# Patient Record
Sex: Female | Born: 1975
Health system: Southern US, Community
[De-identification: ages and names within clinical notes are randomized; demographics above are authoritative.]

## PROBLEM LIST (undated history)

## (undated) ENCOUNTER — Ambulatory Visit: Admission: EM | Payer: Commercial Managed Care - PPO

## (undated) DIAGNOSIS — Z9889 Other specified postprocedural states: Secondary | ICD-10-CM

## (undated) DIAGNOSIS — R112 Nausea with vomiting, unspecified: Secondary | ICD-10-CM

## (undated) DIAGNOSIS — T8859XA Other complications of anesthesia, initial encounter: Secondary | ICD-10-CM

## (undated) DIAGNOSIS — G8929 Other chronic pain: Secondary | ICD-10-CM

## (undated) DIAGNOSIS — N939 Abnormal uterine and vaginal bleeding, unspecified: Secondary | ICD-10-CM

## (undated) DIAGNOSIS — E039 Hypothyroidism, unspecified: Secondary | ICD-10-CM

## (undated) HISTORY — PX: TONSILLECTOMY: SUR1361

## (undated) HISTORY — PX: TUBAL LIGATION: SHX77

---

## 1998-08-29 ENCOUNTER — Ambulatory Visit (HOSPITAL_COMMUNITY): Admission: RE | Admit: 1998-08-29 | Discharge: 1998-08-29 | Payer: Self-pay | Admitting: Obstetrics and Gynecology

## 1998-11-02 ENCOUNTER — Inpatient Hospital Stay (HOSPITAL_COMMUNITY): Admission: AD | Admit: 1998-11-02 | Discharge: 1998-11-02 | Payer: Self-pay | Admitting: Obstetrics and Gynecology

## 1998-11-03 ENCOUNTER — Inpatient Hospital Stay (HOSPITAL_COMMUNITY): Admission: AD | Admit: 1998-11-03 | Discharge: 1998-11-03 | Payer: Self-pay | Admitting: Obstetrics and Gynecology

## 1998-11-17 ENCOUNTER — Inpatient Hospital Stay (HOSPITAL_COMMUNITY): Admission: AD | Admit: 1998-11-17 | Discharge: 1998-11-19 | Payer: Self-pay | Admitting: Obstetrics and Gynecology

## 1999-03-30 ENCOUNTER — Emergency Department (HOSPITAL_COMMUNITY): Admission: EM | Admit: 1999-03-30 | Discharge: 1999-03-30 | Payer: Self-pay | Admitting: Emergency Medicine

## 2001-11-12 HISTORY — PX: TUBAL LIGATION: SHX77

## 2003-11-22 ENCOUNTER — Ambulatory Visit (HOSPITAL_COMMUNITY): Admission: RE | Admit: 2003-11-22 | Discharge: 2003-11-22 | Payer: Self-pay | Admitting: Gastroenterology

## 2004-06-15 ENCOUNTER — Emergency Department (HOSPITAL_COMMUNITY): Admission: EM | Admit: 2004-06-15 | Discharge: 2004-06-16 | Payer: Self-pay

## 2005-12-14 ENCOUNTER — Encounter (INDEPENDENT_AMBULATORY_CARE_PROVIDER_SITE_OTHER): Payer: Self-pay | Admitting: Specialist

## 2005-12-14 ENCOUNTER — Ambulatory Visit (HOSPITAL_BASED_OUTPATIENT_CLINIC_OR_DEPARTMENT_OTHER): Admission: RE | Admit: 2005-12-14 | Discharge: 2005-12-14 | Payer: Self-pay | Admitting: Otolaryngology

## 2006-07-02 ENCOUNTER — Ambulatory Visit: Payer: Self-pay | Admitting: Family Medicine

## 2006-07-02 ENCOUNTER — Encounter: Payer: Self-pay | Admitting: Family Medicine

## 2006-07-03 ENCOUNTER — Ambulatory Visit (HOSPITAL_COMMUNITY): Admission: RE | Admit: 2006-07-03 | Discharge: 2006-07-03 | Payer: Self-pay | Admitting: Gynecology

## 2007-03-17 ENCOUNTER — Encounter: Admission: RE | Admit: 2007-03-17 | Discharge: 2007-03-17 | Payer: Self-pay | Admitting: Endocrinology

## 2008-06-21 ENCOUNTER — Encounter: Admission: RE | Admit: 2008-06-21 | Discharge: 2008-06-21 | Payer: Self-pay | Admitting: Endocrinology

## 2008-06-30 ENCOUNTER — Emergency Department (HOSPITAL_COMMUNITY): Admission: EM | Admit: 2008-06-30 | Discharge: 2008-06-30 | Payer: Self-pay | Admitting: Family Medicine

## 2009-03-21 ENCOUNTER — Ambulatory Visit: Payer: Self-pay | Admitting: Obstetrics & Gynecology

## 2009-10-18 ENCOUNTER — Ambulatory Visit: Payer: Self-pay | Admitting: Obstetrics & Gynecology

## 2009-10-21 ENCOUNTER — Ambulatory Visit (HOSPITAL_COMMUNITY): Admission: RE | Admit: 2009-10-21 | Discharge: 2009-10-21 | Payer: Self-pay | Admitting: Obstetrics & Gynecology

## 2010-10-24 ENCOUNTER — Ambulatory Visit: Payer: Self-pay | Admitting: Family Medicine

## 2010-10-27 ENCOUNTER — Emergency Department (HOSPITAL_COMMUNITY)
Admission: EM | Admit: 2010-10-27 | Discharge: 2010-10-27 | Payer: Self-pay | Source: Home / Self Care | Admitting: Emergency Medicine

## 2010-10-30 ENCOUNTER — Ambulatory Visit: Payer: Self-pay | Admitting: Obstetrics and Gynecology

## 2010-11-21 ENCOUNTER — Ambulatory Visit: Admit: 2010-11-21 | Payer: Self-pay | Admitting: Family Medicine

## 2011-01-16 ENCOUNTER — Ambulatory Visit: Payer: Self-pay | Admitting: Obstetrics and Gynecology

## 2011-01-22 LAB — URINALYSIS, ROUTINE W REFLEX MICROSCOPIC
Bilirubin Urine: NEGATIVE
Glucose, UA: NEGATIVE mg/dL
Ketones, ur: NEGATIVE mg/dL
Nitrite: NEGATIVE
Protein, ur: 30 mg/dL — AB
Specific Gravity, Urine: 1.013 (ref 1.005–1.030)
Urobilinogen, UA: 1 mg/dL (ref 0.0–1.0)
pH: 7 (ref 5.0–8.0)

## 2011-01-22 LAB — URINE MICROSCOPIC-ADD ON

## 2011-01-22 LAB — POCT PREGNANCY, URINE: Preg Test, Ur: NEGATIVE

## 2011-03-27 NOTE — Assessment & Plan Note (Signed)
NAME:  Sherri [name], Sherri [name] NO.:  [id number]   MEDICAL RECORD NO.:  [id number]          PATIENT TYPE:  POB   LOCATION:  [facility] at [facility]         FACILITY:  [facility]   PHYSICIAN:  Scheryl Darter, MD       DATE OF BIRTH:  [birth year]   DATE OF SERVICE:                                  CLINIC NOTE   CHIEF COMPLAINT:  Heavy period.   HISTORY OF PRESENT ILLNESS:  The patient is a 35 year old white female,  gravida 2.  She had been gravida 3, para 2-0-1-2, last menstrual period  October 01, 2009 She says for last 2-3 months periods have gotten very  heavy using super tampon and bleeding through them on to her clothing.  Periods last about 5 days.  She also has occasional left lower quadrant  pain, which is increased during her menses.  Pain radiates into her  back.  She is status post tubal ligation at time of cesarean section in  2003.  She says that about 2 years ago she was seen by Dr. Tiburcio Pea in  [place] due to an ovarian cyst and she had this removed with  laparoscopy.   PAST MEDICAL HISTORY:  1. Asthma.  2. Irritable bowel syndrome with constipation, which she says has      improved and she says it seems to be better just taking a      multivitamin.   MEDICATIONS:  Multivitamin and occasional ibuprofen for pain.   PAST SURGICAL HISTORY:  Tonsillectomy, arthroscopic right knee surgery,  repair of deviated septum, cesarean section, tubal ligation, laparoscopy  and ovarian cystectomy.   ALLERGIES:  Allergies to contrast dye, codeine, morphine, and  erythromycin.   SOCIAL HISTORY:  The patient denies alcohol, tobacco, or drug use.   FAMILY HISTORY:  Coronary heart disease and hypertension.   PHYSICAL EXAMINATION:  GENERAL:  The patient in no acute distress.  VITAL SIGNS:  Weight 206 pounds, BP 125/87, pulse 70, and height 5 foot  5.  ABDOMEN:  Soft, nontender, and no mass.  PELVIC:  External genitalia, vagina, and cervix appeared normal.  Uterus  is normal  size, nontender with some slight tenderness in the  left side.  No mass.  No significant findings in the right adnexa.   IMPRESSION:  Pain and menorrhagia.   PLAN:  Ordered a pelvic ultrasound.  She will return to review this  results.  Gave prescription for her Lysteda 650 mg 2 tablets p.o. t.i.d.  for up to 5 days during her menses.  I also gave her prescription for  Anaprox 275 mg p.o. q.8-12 h. p.r.n. pain.  She may be a candidate for  endometrial ablation.  I gave her a pamphlet on NovaSure.      Scheryl Darter, MD     JA/MEDQ  D:  10/18/2009  T:  10/19/2009  Job:  [id number]

## 2011-03-27 NOTE — Assessment & Plan Note (Signed)
NAME:  GRAE, LEATHERS NO.:  [id number]   MEDICAL RECORD NO.:  [id number]          PATIENT TYPE:  POB   LOCATION:  [facility] at [facility]         FACILITY:  [facility]   PHYSICIAN:  Argentina Donovan, MD        DATE OF BIRTH:  09-14-1976   DATE OF SERVICE:  10/30/2010                                  CLINIC NOTE   The patient is a 35 year old Caucasian female gravida 2, para 2-0-0-2  who underwent C-section 8 years ago.  Over the past couple of years, he  has had increased bleeding following a tubal ligation and numbness and  burning with severe pain on the left side in the area of the transverse  low abdominal incision.  She had an ultrasound a year ago that was  completely normal.  The pain seems to stay there whether she has a  period or not, although it is worse during her period.   On examination her abdomen is soft, flat, tender in that area without  rebound or guarding.  Bimanual is confirmatory for the pain in that area  and pushing up from below and down from above, I cannot really  demonstrate a hernia nor do I feel a lump denoting a neuroma nor  endometriosis.   I am going to put the patient on birth control pills for a couple of  months to see if that does control her bleeding and also to see whether  it effects of pain or not.  I am not exactly sure what to do about the  pain if the pain persists in spite of controlling the bleeding.  I think  that probably she has a numbness there secondary to the incision and the  pain is being magnified because the area around it seems numb.  In any  case, I am going to have her come back in a couple of months after she  is on oral contraceptives for reevaluation.           ______________________________  Argentina Donovan, MD     PR/MEDQ  D:  10/30/2010  T:  10/31/2010  Job:  [id number]

## 2011-03-30 NOTE — Op Note (Signed)
NAME:  Sherri [name], OSE NO.:  [id number]   MEDICAL RECORD NO.:  [id number]          PATIENT TYPE:  AMB   LOCATION:  [facility]                          FACILITY:  [facility]   PHYSICIAN:  Hermelinda Medicus, M.D.   DATE OF BIRTH:  1976-01-13   DATE OF PROCEDURE:  DATE OF DISCHARGE:                                 OPERATIVE REPORT   PREOPERATIVE DIAGNOSIS:  Septal deviation, nasal obstruction, turbinate  hypertrophy, with snoring.   POSTOPERATIVE DIAGNOSIS:  Septal deviation, nasal obstruction, turbinate  hypertrophy, with snoring.   OPERATION:  Septal reconstruction, turbinate reduction, with the laser-  assisted uvulopalatoplasty under local MAC anesthesia with Dr. Jacklynn Bue.   SURGEON:  Hermelinda Medicus, M.D.   PROCEDURE:  The patient was placed in the supine position under local MAC  anesthesia and was prepped and draped in usual manner using the usual head  drape, and then the nose was anesthetized using 200 mg of topical cocaine  and also 6 cc of 1% Xylocaine with epinephrine. The columella was first  approached, which was deviated way over to the left and was also widened  apparently, probably from some of this trauma in the past. This was  mobilized on the left side and brought back. Scar tissue was removed, and  this was trimmed also, as she had somewhat of a drooping or hanging  columella, or a bowing columella. Then, the mucoperichondrium was elevated  back along on the left side and then back.  A strip of quadrilateral  cartilage was taken posteriorly, and then the ethmoid septal deviation was  approached that was over to the right using the open and closed Leane Para-  Middletons and [employer] forceps. A large spur was also on the right side,  and this was taken down using the 4 mm chisel and the Takahashi forceps once  again.  The cartilaginous septum was then set on the midline. Once this was  set in the midline, and the once the bowing of the cartilaginous septum was  corrected, the columella deviation was also much easier to correct. This was  brought back into the midline between the medial crura of the lower lateral  cartilages, and then the closure of the incision was made with 5-0 plain  catgut and then through-and-through septal suture using 4-0 plain as a  hemostatic stitch, as a blanket stitch, and also to ensure the columella  stayed in its midline position.  This was done x2. At this point, two 8 cm  Merocel packs were placed, and then the oral cavity was approached, where we  sprayed the throat with Cetacaine, and then we used 1% Xylocaine with  epinephrine once again in the two areas of the palate and the uvula. The  laser was set at 10 watts continuous, and lateral superior cuts were made of  approximately 1 cm in length on each side of the uvula, and the enlarged  uvula was trimmed to approximately half its size, more to the normal size.  There was no bleeding noted, and there was no problem during this procedure.  The patient tolerated  the procedure very well. The time of laser was 6  minutes. All glasses were used on surgical  employees, and the patient's face and eyes were carefully draped with cool  damp towels and 4x4 sponges over the eyes. The patient tolerated the  procedure very well and is doing well postop,and will follow up with me this  afternoon at 3:00 p.m., and then in 5 days then in 2 weeks, 4 weeks 6 weeks,  3 months, and 6 months.           ______________________________  Hermelinda Medicus, M.D.     JC/MEDQ  D:  12/14/2005  T:  12/14/2005  Job:  [id number]   cc:   Molly Maduro A. Nicholos Johns, M.D.  Fax: [phone]
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
## 2011-03-30 NOTE — Assessment & Plan Note (Signed)
NAME:  Sherri [name], Sherri [name] NO.:  [id number]   MEDICAL RECORD NO.:  [id number]          PATIENT TYPE:  POB   LOCATION:  [facility] at [facility]         FACILITY:  [facility]   PHYSICIAN:  Tinnie Gens, MD        DATE OF BIRTH:  1976-06-28   DATE OF SERVICE:  07/02/2006                                    CLINIC NOTE   CHIEF COMPLAINT:  Physical exam.   HISTORY OF PRESENT ILLNESS:  The patient is a 35 year old gravida 3, para 2-  0-1-2 who comes in for yearly exam.  She has been having regular cycles once  a month, they are heavy in nature and last for approximately 5-7 days,  previously had lighter cycles but was on the pill at the time.  Her last  delivery she had a C-section and tubal ligation and is on nothing for birth  control.  The patient is concerned today about some weight gain and fatigue  she has been having.   PAST MEDICAL HISTORY:  1. Asthma.  She has albuterol MDI she uses p.r.n.  2. Irritable bowel syndrome with constipation she uses MiraLax for p.r.n.   SURGICAL HISTORY:  She had tonsillectomy, arthroscopic right knee surgery,  repair of deviated septum and C-section and tubal.   MEDICATIONS:  She is on multivitamin a day.   ALLERGIES:  CONTRAST DYE, CODEINE, MORPHINE, AND ERYTHROMYCIN.   OBSTETRICAL HISTORY:  G3, P2-1 C-section plus tubal ligation.   GYNECOLOGIC HISTORY:  No history of abnormal Pap smear.   FAMILY HISTORY:  Coronary artery disease, hypertension.   SOCIAL HISTORY:  No tobacco, alcohol or drug use.   REVIEW OF SYSTEMS:  Fourteen point review of systems is reviewed.  Please  see GYN history in the chart but is essentially negative except as in the  HPI.   EXAMINATION:  VITAL SIGNS:  Blood pressure is 123/71, weight is 191, pulse  is 58.  GENERAL:  She is a well-developed, well-nourished female in no acute  distress.  RESPIRATORY:  Lungs are clear bilaterally.  CV:  Regular rate and rhythm.  No rubs, gallops, or murmurs.  ABDOMEN:   Soft, nontender, nondistended.  No masses.  NECK:  Supple.  She has an enlarged soft portion of the thyroid on her right  side.  BREASTS:  Symmetrical with everted nipples.  No masses.  LYMPHATICS:  No supraclavicular or axillary adenopathy.  GU:  Normal external female genitalia.  BUS is normal.  Vagina is pink and  rugae.  Cervix is parous without lesion.  Uterus is small and anteverted.  Adnexa are without masses or tenderness.  EXTREMITIES:  No cyanosis, clubbing, or edema.   IMPRESSION:  1. Goiter.  2. Gynecologic exam.   PLAN:  1. Pap smear __________ .  2. Thyroid ultrasound to be followed up by probable radioactive iodine      uptake test.  Results should be forwarded once they are received to her      primary care physician.           ______________________________  Tinnie Gens, MD     TP/MEDQ  D:  07/02/2006  T:  07/03/2006  Job:  [id number]

## 2011-03-30 NOTE — Op Note (Signed)
NAME:  Sherri [name], Sherri [name]                          ACCOUNT NO.:  [id number]   MEDICAL RECORD NO.:  [id number]                   PATIENT TYPE:  AMB   LOCATION:  [facility]                                 FACILITY:  [facility]   PHYSICIAN:  John C. Madilyn Fireman, M.D.                 DATE OF BIRTH:  04-22-1976   DATE OF PROCEDURE:  11/22/2003  DATE OF DISCHARGE:                                 OPERATIVE REPORT   PROCEDURE:  Colonoscopy.   INDICATIONS FOR PROCEDURE:  Family history of colon polyps in first-degree  relative, as well as chronic constipation with abdominal irritable bowel  syndrome; with suboptimal response to medical therapy.   DESCRIPTION OF PROCEDURE:  The patient was placed in the left lateral  decubitus position and placed on the pulse monitor, with continuous low-flow  oxygen delivered by nasal cannula.  She was sedated with 52.5 mcg IV  fentanyl and 6 mg IV Versed.  The Olympus video colonoscope was inserted  into the rectum and advanced.  Despite multiple abdominal pressures due to  torsion, and repositioning as I was unable to reach the cecum; thus poor  visualization but felt to be somewhere in the transverse colon.  This area  appeared normal, as did the ascending, sigmoid and rectum -- with no masses,  polyps, diverticula or other mucosal abnormalities.  The scope was then  withdrawn and the patient returned to the recovery room in stable condition.  She tolerated the procedure well and there were no immediate complications.   IMPRESSION:  Normal incomplete colonoscopy to the transverse colon.   PLAN:  Follow-up barium enema.                                               John C. Madilyn Fireman, M.D.    JCH/MEDQ  D:  11/22/2003  T:  11/22/2003  Job:  [id number]

## 2011-05-23 ENCOUNTER — Telehealth: Payer: Self-pay

## 2011-05-23 NOTE — Telephone Encounter (Signed)
Patient is calling about a refill on Macrobid. She said pharmacy sent over a request twice already. Please call it in to [facility] in [place]. Their should be a prescription fax for it. Thanks!

## 2011-05-24 ENCOUNTER — Other Ambulatory Visit: Payer: Self-pay | Admitting: *Deleted

## 2011-05-24 ENCOUNTER — Telehealth: Payer: Self-pay

## 2011-05-24 DIAGNOSIS — N390 Urinary tract infection, site not specified: Secondary | ICD-10-CM

## 2011-05-24 MED ORDER — NITROFURANTOIN MONOHYD MACRO 100 MG PO CAPS: 100.0000 mg | ORAL_CAPSULE | Freq: Two times a day (BID) | ORAL | Status: AC

## 2011-05-24 MED ORDER — NITROFURANTOIN MACROCRYSTAL 50 MG PO CAPS: 50.0000 mg | ORAL_CAPSULE | ORAL | Status: DC

## 2011-05-24 NOTE — Telephone Encounter (Signed)
Patient called back saying that she needs you to call two meds in. She thinks there was a mis communication due to her phone cutting off. She needs the med for before and after sex and also for a UTI b/c she has one now. She apologies for the confusion.

## 2011-05-24 NOTE — Telephone Encounter (Signed)
Patient is currently having a urinary tract infection because she had run out of the Macrodantin.  Request refill of Macrobid

## 2011-05-24 NOTE — Telephone Encounter (Signed)
Patient has frequent uti's post intercourse and used the Macrodantin to prevent.

## 2011-06-12 ENCOUNTER — Telehealth: Payer: Self-pay | Admitting: *Deleted

## 2011-06-12 DIAGNOSIS — A499 Bacterial infection, unspecified: Secondary | ICD-10-CM

## 2011-06-12 MED ORDER — METRONIDAZOLE 500 MG PO TABS: 500.0000 mg | ORAL_TABLET | Freq: Two times a day (BID) | ORAL | Status: AC

## 2011-06-12 NOTE — Telephone Encounter (Signed)
Patient complains of vaginal discharge that has a slight odor and irritation.  She has been swimming a lot this week.  Feels like she has a bacterial infection.

## 2011-12-04 ENCOUNTER — Encounter (HOSPITAL_COMMUNITY): Payer: Self-pay | Admitting: Emergency Medicine

## 2011-12-04 ENCOUNTER — Emergency Department (INDEPENDENT_AMBULATORY_CARE_PROVIDER_SITE_OTHER): Payer: 59

## 2011-12-04 ENCOUNTER — Emergency Department (INDEPENDENT_AMBULATORY_CARE_PROVIDER_SITE_OTHER)
Admission: EM | Admit: 2011-12-04 | Discharge: 2011-12-04 | Disposition: A | Discharge status: Home / Self Care | Payer: 59 | Source: Home / Self Care

## 2011-12-04 DIAGNOSIS — S161XXA Strain of muscle, fascia and tendon at neck level, initial encounter: Secondary | ICD-10-CM

## 2011-12-04 DIAGNOSIS — R51 Headache: Secondary | ICD-10-CM

## 2011-12-04 DIAGNOSIS — S139XXA Sprain of joints and ligaments of unspecified parts of neck, initial encounter: Secondary | ICD-10-CM

## 2011-12-04 MED ORDER — IBUPROFEN 800 MG PO TABS: 800.0000 mg | ORAL_TABLET | Freq: Three times a day (TID) | ORAL | Status: AC

## 2011-12-04 MED ORDER — METHOCARBAMOL 750 MG PO TABS: 750.0000 mg | ORAL_TABLET | Freq: Four times a day (QID) | ORAL | Status: AC

## 2011-12-04 MED ORDER — TRAMADOL HCL 50 MG PO TABS: 50.0000 mg | ORAL_TABLET | Freq: Four times a day (QID) | ORAL | Status: AC | PRN
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
## 2011-12-05 NOTE — ED Provider Notes (Signed)
Medical screening examination/treatment/procedure(s) were performed by non-physician practitioner and as supervising physician I was immediately available for consultation/collaboration.  Raynald Blend, MD 12/05/11 1535

## 2014-01-13 ENCOUNTER — Ambulatory Visit: Payer: Self-pay | Admitting: Obstetrics & Gynecology

## 2014-03-22 ENCOUNTER — Emergency Department (INDEPENDENT_AMBULATORY_CARE_PROVIDER_SITE_OTHER)
Admission: EM | Admit: 2014-03-22 | Discharge: 2014-03-22 | Disposition: A | Discharge status: Home / Self Care | Payer: 59 | Source: Home / Self Care | Attending: Family Medicine | Admitting: Family Medicine

## 2014-03-22 ENCOUNTER — Encounter (HOSPITAL_COMMUNITY): Payer: Self-pay | Admitting: Emergency Medicine

## 2014-03-22 DIAGNOSIS — N390 Urinary tract infection, site not specified: Secondary | ICD-10-CM

## 2014-03-22 LAB — POCT URINALYSIS DIP (DEVICE)
Bilirubin Urine: NEGATIVE
Glucose, UA: 100 mg/dL — AB
Ketones, ur: NEGATIVE mg/dL
Nitrite: POSITIVE — AB
Protein, ur: 30 mg/dL — AB
Specific Gravity, Urine: 1.01 (ref 1.005–1.030)
Urobilinogen, UA: 0.2 mg/dL (ref 0.0–1.0)
pH: 6.5 (ref 5.0–8.0)

## 2014-03-22 LAB — POCT PREGNANCY, URINE: Preg Test, Ur: NEGATIVE

## 2014-03-22 MED ORDER — NITROFURANTOIN MACROCRYSTAL 50 MG PO CAPS: ORAL_CAPSULE | ORAL | Status: DC

## 2014-03-22 MED ORDER — CEPHALEXIN 500 MG PO CAPS: 500.0000 mg | ORAL_CAPSULE | Freq: Two times a day (BID) | ORAL | Status: DC

## 2014-03-22 NOTE — ED Notes (Signed)
uti symptoms: burning with urination, urgency, frequency.

## 2014-03-22 NOTE — ED Provider Notes (Signed)
Sherri CossDenise M [name] is a 38 y.o. female who presents to Urgent Care today for UTI symptoms. Patient has had 2-3 days of urinary frequency urgency burning and pressure. Symptoms are consistent with prior episodes of UTI. She's tried over-the-counter urinary tract infection medications which have not helped. She denies any fevers or chills nausea vomiting or diarrhea. She feels well otherwise.   History reviewed. No pertinent past medical history. History  Substance Use Topics  . Smoking status: Never Smoker   . Smokeless tobacco: Not on file  . Alcohol Use: No   ROS as above Medications: No current facility-administered medications for this encounter.   Current Outpatient Prescriptions  Medication Sig Dispense Refill  . cephALEXin (KEFLEX) 500 MG capsule Take 1 capsule (500 mg total) by mouth 2 (two) times daily.  14 capsule  0  . nitrofurantoin (MACRODANTIN) 50 MG capsule 1 pill po pose intercourse.  30 capsule  1    Exam:  BP 128/61  Pulse 70  Temp(Src) 98.5 F (36.9 C) (Oral)  Resp 16  SpO2 99%  LMP 03/06/2014 Gen: Well NAD HEENT: EOMI,  MMM Lungs: Normal work of breathing. CTABL Heart: RRR no MRG Abd: NABS, Soft. NT, ND no CV angle tenderness to percussion Exts: Brisk capillary refill, warm and well perfused.   Results for orders placed during the hospital encounter of 03/22/14 (from the past 24 hour(s))  POCT URINALYSIS DIP (DEVICE)     Status: Abnormal   Collection Time    03/22/14  9:29 AM      Result Value Ref Range   Glucose, UA 100 (*) NEGATIVE mg/dL   Bilirubin Urine NEGATIVE  NEGATIVE   Ketones, ur NEGATIVE  NEGATIVE mg/dL   Specific Gravity, Urine 1.010  1.005 - 1.030   Hgb urine dipstick MODERATE (*) NEGATIVE   pH 6.5  5.0 - 8.0   Protein, ur 30 (*) NEGATIVE mg/dL   Urobilinogen, UA 0.2  0.0 - 1.0 mg/dL   Nitrite POSITIVE (*) NEGATIVE   Leukocytes, UA LARGE (*) NEGATIVE  POCT PREGNANCY, URINE     Status: None   Collection Time    03/22/14  9:33 AM   Result Value Ref Range   Preg Test, Ur NEGATIVE  NEGATIVE   No results found.  Assessment and Plan: 38 y.o. female with UTI. Plan treatment with Keflex. Patient has been prescribed nitrofurantoin for prevention of UTI post intercourse. We'll also refill this. Recommend followup with primary care provider.  Discussed warning signs or symptoms. Please see discharge instructions. Patient expresses understanding.    Rodolph BongEvan S Aasia Peavler, MD 03/22/14 1034

## 2014-03-22 NOTE — Discharge Instructions (Signed)
Thank you for coming in today. Take Keflex twice daily for one week.  Use  Macrobid after intercourse to prevent UTI . Followup with your OB/GYN or primary care Dr. for further refills of this medication .  If your belly pain worsens, or you have high fever, bad vomiting, blood in your stool or black tarry stool go to the Emergency Room.   Urinary Tract Infection Urinary tract infections (UTIs) can develop anywhere along your urinary tract. Your urinary tract is your body's drainage system for removing wastes and extra water. Your urinary tract includes two kidneys, two ureters, a bladder, and a urethra. Your kidneys are a pair of bean-shaped organs. Each kidney is about the size of your fist. They are located below your ribs, one on each side of your spine. CAUSES Infections are caused by microbes, which are microscopic organisms, including fungi, viruses, and bacteria. These organisms are so small that they can only be seen through a microscope. Bacteria are the microbes that most commonly cause UTIs. SYMPTOMS  Symptoms of UTIs may vary by age and gender of the patient and by the location of the infection. Symptoms in young women typically include a frequent and intense urge to urinate and a painful, burning feeling in the bladder or urethra during urination. Older women and men are more likely to be tired, shaky, and weak and have muscle aches and abdominal pain. A fever may mean the infection is in your kidneys. Other symptoms of a kidney infection include pain in your back or sides below the ribs, nausea, and vomiting. DIAGNOSIS To diagnose a UTI, your caregiver will ask you about your symptoms. Your caregiver also will ask to provide a urine sample. The urine sample will be tested for bacteria and white blood cells. White blood cells are made by your body to help fight infection. TREATMENT  Typically, UTIs can be treated with medication. Because most UTIs are caused by a bacterial infection, they  usually can be treated with the use of antibiotics. The choice of antibiotic and length of treatment depend on your symptoms and the type of bacteria causing your infection. HOME CARE INSTRUCTIONS  If you were prescribed antibiotics, take them exactly as your caregiver instructs you. Finish the medication even if you feel better after you have only taken some of the medication.  Drink enough water and fluids to keep your urine clear or pale yellow.  Avoid caffeine, tea, and carbonated beverages. They tend to irritate your bladder.  Empty your bladder often. Avoid holding urine for long periods of time.  Empty your bladder before and after sexual intercourse.  After a bowel movement, women should cleanse from front to back. Use each tissue only once. SEEK MEDICAL CARE IF:   You have back pain.  You develop a fever.  Your symptoms do not begin to resolve within 3 days. SEEK IMMEDIATE MEDICAL CARE IF:   You have severe back pain or lower abdominal pain.  You develop chills.  You have nausea or vomiting.  You have continued burning or discomfort with urination. MAKE SURE YOU:   Understand these instructions.  Will watch your condition.  Will get help right away if you are not doing well or get worse. Document Released: 08/08/2005 Document Revised: 04/29/2012 Document Reviewed: 12/07/2011 [facility] Patient Information 2014 [employer] [place].

## 2014-08-06 ENCOUNTER — Ambulatory Visit: Payer: Self-pay | Admitting: Obstetrics & Gynecology

## 2015-02-24 ENCOUNTER — Telehealth: Payer: Self-pay | Admitting: *Deleted

## 2015-02-24 ENCOUNTER — Encounter: Payer: Self-pay | Admitting: *Deleted

## 2015-02-24 NOTE — Telephone Encounter (Signed)
-----   Message from Pennie BanterMarni W Smith sent at 02/24/2015  9:25 AM EDT ----- Patient would like for you to call her.

## 2015-02-24 NOTE — Telephone Encounter (Signed)
Patient has questions regarding home pregnancy tests.  Advised patient that if home test was positive she needs to come in to have confirmation testing done as she had her tubes tied 12 years ago.  Last period end of Feb beginning of March but it was very light just a couple of days of spotting.

## 2015-02-28 ENCOUNTER — Other Ambulatory Visit (INDEPENDENT_AMBULATORY_CARE_PROVIDER_SITE_OTHER): Payer: 59 | Admitting: *Deleted

## 2015-02-28 DIAGNOSIS — N912 Amenorrhea, unspecified: Secondary | ICD-10-CM | POA: Diagnosis not present

## 2015-02-28 LAB — POCT URINE PREGNANCY: Preg Test, Ur: NEGATIVE

## 2015-02-28 NOTE — Progress Notes (Signed)
Patient had tubal ligation 12 years ago.  Had two positive home pregnancy tests last week.  Would like confirmation.

## 2015-03-01 LAB — HCG, QUANTITATIVE, PREGNANCY: hCG, Beta Chain, Quant, S: <2 m[IU]/mL

## 2016-01-08 ENCOUNTER — Emergency Department (HOSPITAL_COMMUNITY)
Admission: EM | Admit: 2016-01-08 | Discharge: 2016-01-08 | Disposition: A | Discharge status: Home / Self Care | Payer: Commercial Managed Care - PPO | Source: Home / Self Care | Attending: Emergency Medicine | Admitting: Emergency Medicine

## 2016-01-08 ENCOUNTER — Encounter (HOSPITAL_COMMUNITY): Payer: Self-pay | Admitting: Emergency Medicine

## 2016-01-08 DIAGNOSIS — J40 Bronchitis, not specified as acute or chronic: Secondary | ICD-10-CM | POA: Diagnosis not present

## 2016-01-08 MED ORDER — AZITHROMYCIN 250 MG PO TABS: ORAL_TABLET | ORAL | Status: DC

## 2016-01-08 MED ORDER — IPRATROPIUM-ALBUTEROL 0.5-2.5 (3) MG/3ML IN SOLN
RESPIRATORY_TRACT | Status: AC
  Filled 2016-01-08: qty 3

## 2016-01-08 MED ORDER — IPRATROPIUM-ALBUTEROL 0.5-2.5 (3) MG/3ML IN SOLN
3.0000 mL | Freq: Once | RESPIRATORY_TRACT | Status: AC
  Administered 2016-01-08: 3 mL via RESPIRATORY_TRACT

## 2016-01-08 MED ORDER — HYDROCODONE-HOMATROPINE 5-1.5 MG/5ML PO SYRP: 5.0000 mL | ORAL_SOLUTION | Freq: Four times a day (QID) | ORAL | Status: DC | PRN

## 2016-01-08 MED ORDER — PREDNISONE 50 MG PO TABS: ORAL_TABLET | ORAL | Status: DC

## 2016-01-08 MED ORDER — ALBUTEROL SULFATE HFA 108 (90 BASE) MCG/ACT IN AERS: 2.0000 | INHALATION_SPRAY | RESPIRATORY_TRACT | Status: DC | PRN

## 2016-01-08 NOTE — ED Provider Notes (Signed)
CSN: [id number]     Arrival date & time 01/08/16  1305 History   First MD Initiated Contact with Patient 01/08/16 1328     Chief Complaint  Patient presents with  . URI   (Consider location/radiation/quality/duration/timing/severity/associated sxs/prior Treatment) HPI  She is a 40 year old woman here for evaluation of cough and fever. She states for the last 3 days she has had gradually worsening nasal congestion, sore throat, and cough. Last night she developed a fever to 103 associated with chills. She does report some headaches, but denies body aches. Reports decreased appetite, but is tolerating liquids well. No nausea or vomiting. She also reports feeling short of breath with exertion. Her son was recently diagnosed with flu and bronchitis. Her father was also recently diagnosed with bronchitis. She's been trying over-the-counter medications without improvement.  History reviewed. No pertinent past medical history. Past Surgical History  Procedure Laterality Date  . Tubal ligation    . Cesarean section     No family history on file. Social History  Substance Use Topics  . Smoking status: Never Smoker   . Smokeless tobacco: None  . Alcohol Use: No   OB History    No data available     Review of Systems As in history of present illness Allergies  Codeine and Ivp dye  Home Medications   Prior to Admission medications   Medication Sig Start Date End Date Taking? Authorizing Provider  albuterol (PROVENTIL HFA;VENTOLIN HFA) 108 (90 Base) MCG/ACT inhaler Inhale 2 puffs into the lungs every 4 (four) hours as needed for wheezing or shortness of breath. 01/08/16   Charm Rings, MD  azithromycin (ZITHROMAX Z-PAK) 250 MG tablet Take 2 pills today, then 1 pill daily until gone. 01/08/16   Charm Rings, MD  HYDROcodone-homatropine (HYCODAN) 5-1.5 MG/5ML syrup Take 5 mLs by mouth every 6 (six) hours as needed for cough. 01/08/16   Charm Rings, MD  predniSONE (DELTASONE) 50 MG tablet Take  1 pill daily for 5 days. 01/08/16   Charm Rings, MD   Meds Ordered and Administered this Visit   Medications  ipratropium-albuterol (DUONEB) 0.5-2.5 (3) MG/3ML nebulizer solution 3 mL (3 mLs Nebulization Given 01/08/16 1356)    BP 120/80 mmHg  Pulse 69  Temp(Src) 97.7 F (36.5 C) (Oral)  Resp 16  SpO2 98% No data found.   Physical Exam  Constitutional: She is oriented to person, place, and time. She appears well-developed and well-nourished. No distress.  HENT:  Mouth/Throat: No oropharyngeal exudate.  Mild oropharyngeal erythema. Nasal discharge present.  Neck: Neck supple.  Cardiovascular: Normal rate, regular rhythm and normal heart sounds.   No murmur heard. Pulmonary/Chest: Effort normal. No respiratory distress. She has wheezes (occasional expiratory wheeze). She has no rales.  Lymphadenopathy:    She has no cervical adenopathy.  Neurological: She is alert and oriented to person, place, and time.    ED Course  Procedures (including critical care time)  Labs Review Labs Reviewed - No data to display  Imaging Review No results found.    MDM   1. Bronchitis    Wheezing resolved after DuoNeb.  I suspect this is more bronchitis rather than flu. Treat with prednisone and azithromycin. Hycodan as needed for cough. Albuterol as needed for wheezing. Follow-up as needed.    Charm Rings, MD 01/08/16 [id number]

## 2016-01-08 NOTE — ED Notes (Signed)
C/o cold sx onset x3 days associated w/prod cough, congestion, chlls, fever last night of 103 Taking acetaminophen and ibup w/temp relief Reports son has been dx w/flu and bronchitis A&O x4... No acute distress.

## 2016-01-08 NOTE — Discharge Instructions (Signed)
You have bronchitis. °Take azithromycin and prednisone as prescribed. °Use hycodan as needed for cough. °Use the albuterol every 4 hours as needed for wheezing or cough. °You should see improvement in the next 3-5 days. °If you develop fevers, difficulty breathing, or are just not getting better, please come back or go to the emergency room. ° °

## 2016-10-27 ENCOUNTER — Telehealth: Payer: Commercial Managed Care - PPO | Admitting: Physician Assistant

## 2016-10-27 DIAGNOSIS — R509 Fever, unspecified: Secondary | ICD-10-CM

## 2016-10-27 NOTE — Progress Notes (Signed)
Based on what you shared with me it looks like you have a  condition that should be evaluated in a face to face office visit.  You are endorsing a fever > 102. As such you need a good physical examination and assessment to rule out more serious infections and so that the appropriate treatment can be started.    NOTE: Even if you have entered your credit card information for this eVisit, you will not be charged.   If you are having a true medical emergency please call 911.  If you need an urgent face to face visit, [place] has four urgent care centers for your convenience.  If you need care fast and have a high deductible or no insurance consider:   [email or web address]  [phone]  [address], [address] [place], [place] [address] 8 am to 8 pm Monday-Friday 10 am to 4 pm Saturday-Sunday   The following sites will take your  insurance:    . [facility]  [phone] Get Driving Directions Find a Provider at this Location  [address] [place], [place] [address] . 10 am to 8 pm Monday-Friday . 12 pm to 8 pm Saturday-Sunday   . [facility]  [phone] Get Driving Directions Find a Provider at this Location  [address] [place], [place] [address] . 8 am to 8 pm Monday-Friday . 9 am to 6 pm Saturday . 11 am to 6 pm Sunday   . [facility]  [phone] Get Driving Directions  [address] [place], [place] [address] . 8 am to 8 pm Monday-Friday . 8 am to 4 pm Saturday-Sunday   . [facility] (walk-ins welcome, or call for a scheduled time)  [phone]  Get Driving Directions Find a Provider at this Location  [address] [place], [place] [address] . 8 am to 8:30 pm Monday-Thursday . 8 am to 6 pm Friday . 8 am to 4 pm Saturday-Sunday   Your e-visit answers were reviewed by a board certified advanced  clinical practitioner to complete your personal care plan.  Thank you for using e-Visits.

## 2016-11-19 ENCOUNTER — Telehealth: Payer: Commercial Managed Care - PPO | Admitting: Family

## 2016-11-19 DIAGNOSIS — J329 Chronic sinusitis, unspecified: Secondary | ICD-10-CM

## 2016-11-19 DIAGNOSIS — B9689 Other specified bacterial agents as the cause of diseases classified elsewhere: Secondary | ICD-10-CM

## 2016-11-19 MED ORDER — AMOXICILLIN-POT CLAVULANATE 875-125 MG PO TABS: 1.0000 | ORAL_TABLET | Freq: Two times a day (BID) | ORAL | 0 refills | Status: AC

## 2016-11-19 NOTE — Progress Notes (Signed)

## 2017-01-10 ENCOUNTER — Telehealth: Payer: Commercial Managed Care - PPO | Admitting: Family

## 2017-01-10 DIAGNOSIS — J329 Chronic sinusitis, unspecified: Secondary | ICD-10-CM

## 2017-01-10 DIAGNOSIS — B9789 Other viral agents as the cause of diseases classified elsewhere: Secondary | ICD-10-CM

## 2017-01-10 MED ORDER — FLUTICASONE PROPIONATE 50 MCG/ACT NA SUSP: 2.0000 | Freq: Every day | NASAL | 0 refills | Status: DC

## 2017-01-10 NOTE — Progress Notes (Signed)

## 2018-06-24 ENCOUNTER — Emergency Department (HOSPITAL_COMMUNITY)
Admission: EM | Admit: 2018-06-24 | Discharge: 2018-06-24 | Disposition: A | Discharge status: Home / Self Care | Payer: Commercial Managed Care - PPO | Attending: Emergency Medicine | Admitting: Emergency Medicine

## 2018-06-24 ENCOUNTER — Encounter (HOSPITAL_COMMUNITY): Payer: Self-pay | Admitting: *Deleted

## 2018-06-24 ENCOUNTER — Emergency Department (HOSPITAL_COMMUNITY): Payer: Commercial Managed Care - PPO

## 2018-06-24 DIAGNOSIS — M545 Low back pain: Secondary | ICD-10-CM | POA: Diagnosis not present

## 2018-06-24 DIAGNOSIS — Z79899 Other long term (current) drug therapy: Secondary | ICD-10-CM | POA: Diagnosis not present

## 2018-06-24 DIAGNOSIS — M25562 Pain in left knee: Secondary | ICD-10-CM | POA: Insufficient documentation

## 2018-06-24 MED ORDER — METHOCARBAMOL 500 MG PO TABS: 500.0000 mg | ORAL_TABLET | Freq: Two times a day (BID) | ORAL | 0 refills | Status: AC

## 2018-06-24 MED ORDER — ACETAMINOPHEN 325 MG PO TABS
650.0000 mg | ORAL_TABLET | Freq: Once | ORAL | Status: AC
  Administered 2018-06-24: 650 mg via ORAL
  Filled 2018-06-24: qty 2

## 2018-06-24 NOTE — Discharge Instructions (Addendum)
I have prescribed muscle relaxers, please do not drink or drive while taking this medication as it can make you drowsy. Please follow up with PCP in 1 week for reevaluation of your symptoms. If you experience any chest pain, shortness of breath or your symptoms worsen please return to the ED.
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
## 2018-06-24 NOTE — ED Notes (Signed)
Patient transported to X-ray 
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
## 2018-10-03 ENCOUNTER — Telehealth: Payer: Commercial Managed Care - PPO | Admitting: Family

## 2018-10-03 DIAGNOSIS — B9689 Other specified bacterial agents as the cause of diseases classified elsewhere: Secondary | ICD-10-CM

## 2018-10-03 DIAGNOSIS — J028 Acute pharyngitis due to other specified organisms: Secondary | ICD-10-CM

## 2018-10-03 MED ORDER — BENZONATATE 100 MG PO CAPS: 100.0000 mg | ORAL_CAPSULE | Freq: Three times a day (TID) | ORAL | 0 refills | Status: DC | PRN

## 2018-10-03 MED ORDER — PREDNISONE 5 MG PO TABS: 5.0000 mg | ORAL_TABLET | ORAL | 0 refills | Status: DC

## 2018-10-03 MED ORDER — ALBUTEROL SULFATE HFA 108 (90 BASE) MCG/ACT IN AERS: 2.0000 | INHALATION_SPRAY | RESPIRATORY_TRACT | 2 refills | Status: DC | PRN

## 2018-10-03 MED ORDER — AZITHROMYCIN 250 MG PO TABS: ORAL_TABLET | ORAL | 0 refills | Status: DC

## 2018-10-03 NOTE — Progress Notes (Signed)

## 2018-11-10 ENCOUNTER — Encounter: Payer: Self-pay | Admitting: Family

## 2018-11-10 ENCOUNTER — Telehealth: Payer: Commercial Managed Care - PPO | Admitting: Family

## 2018-11-10 DIAGNOSIS — R05 Cough: Secondary | ICD-10-CM

## 2018-11-10 DIAGNOSIS — J028 Acute pharyngitis due to other specified organisms: Secondary | ICD-10-CM

## 2018-11-10 DIAGNOSIS — J329 Chronic sinusitis, unspecified: Secondary | ICD-10-CM

## 2018-11-10 DIAGNOSIS — R059 Cough, unspecified: Secondary | ICD-10-CM

## 2018-11-10 DIAGNOSIS — B9689 Other specified bacterial agents as the cause of diseases classified elsewhere: Secondary | ICD-10-CM

## 2018-11-10 MED ORDER — BENZONATATE 100 MG PO CAPS: 100.0000 mg | ORAL_CAPSULE | Freq: Three times a day (TID) | ORAL | 0 refills | Status: DC | PRN

## 2018-11-10 MED ORDER — AMOXICILLIN-POT CLAVULANATE 875-125 MG PO TABS: 1.0000 | ORAL_TABLET | Freq: Two times a day (BID) | ORAL | 0 refills | Status: AC

## 2018-11-10 NOTE — Progress Notes (Signed)
Thank you for the details you included in the comment boxes. Those details are very helpful in determining the best course of treatment for you and help us to provide the best care.  We are sorry that you are not feeling well.  Here is how we plan to help!  Based on what you have shared with me it looks like you have sinusitis.  Sinusitis is inflammation and infection in the sinus cavities of the head.  Based on your presentation I believe you most likely have Acute Bacterial Sinusitis.  This is an infection caused by bacteria and is treated with antibiotics. I have prescribed Augmentin 875mg/125mg one tablet twice daily with food, for 7 days. You may use an oral decongestant such as Mucinex D or if you have glaucoma or high blood pressure use plain Mucinex. Saline nasal spray help and can safely be used as often as needed for congestion.  If you develop worsening sinus pain, fever or notice severe headache and vision changes, or if symptoms are not better after completion of antibiotic, please schedule an appointment with a health care provider.    I have also sent Tessalon Perles 100mg, take 1-2 every 8 hours as needed for cough.    Sinus infections are not as easily transmitted as other respiratory infection, however we still recommend that you avoid close contact with loved ones, especially the very young and elderly.  Remember to wash your hands thoroughly throughout the day as this is the number one way to prevent the spread of infection!  Home Care:  Only take medications as instructed by your medical team.  Complete the entire course of an antibiotic.  Do not take these medications with alcohol.  A steam or ultrasonic humidifier can help congestion.  You can place a towel over your head and breathe in the steam from hot water coming from a faucet.  Avoid close contacts especially the very young and the elderly.  Cover your mouth when you cough or sneeze.  Always remember to wash your  hands.  Get Help Right Away If:  You develop worsening fever or sinus pain.  You develop a severe head ache or visual changes.  Your symptoms persist after you have completed your treatment plan.  Make sure you  Understand these instructions.  Will watch your condition.  Will get help right away if you are not doing well or get worse.  Your e-visit answers were reviewed by a board certified advanced clinical practitioner to complete your personal care plan.  Depending on the condition, your plan could have included both over the counter or prescription medications.  If there is a problem please reply  once you have received a response from your provider.  Your safety is important to us.  If you have drug allergies check your prescription carefully.    You can use MyChart to ask questions about today's visit, request a non-urgent call back, or ask for a work or school excuse for 24 hours related to this e-Visit. If it has been greater than 24 hours you will need to follow up with your provider, or enter a new e-Visit to address those concerns.  You will get an e-mail in the next two days asking about your experience.  I hope that your e-visit has been valuable and will speed your recovery. Thank you for using e-visits.     

## 2018-11-12 DIAGNOSIS — E063 Autoimmune thyroiditis: Secondary | ICD-10-CM

## 2018-11-12 HISTORY — DX: Autoimmune thyroiditis: E06.3

## 2019-01-29 ENCOUNTER — Telehealth: Payer: Commercial Managed Care - PPO | Admitting: Family

## 2019-01-29 DIAGNOSIS — J019 Acute sinusitis, unspecified: Secondary | ICD-10-CM

## 2019-01-29 DIAGNOSIS — B9689 Other specified bacterial agents as the cause of diseases classified elsewhere: Secondary | ICD-10-CM | POA: Diagnosis not present

## 2019-01-29 MED ORDER — AMOXICILLIN-POT CLAVULANATE 875-125 MG PO TABS: 1.0000 | ORAL_TABLET | Freq: Two times a day (BID) | ORAL | 0 refills | Status: DC

## 2019-01-29 NOTE — Progress Notes (Signed)

## 2019-09-01 ENCOUNTER — Ambulatory Visit
Admission: EM | Admit: 2019-09-01 | Discharge: 2019-09-01 | Disposition: A | Discharge status: Home / Self Care | Payer: Commercial Managed Care - PPO | Attending: Family Medicine | Admitting: Family Medicine

## 2019-09-01 ENCOUNTER — Other Ambulatory Visit: Payer: Self-pay

## 2019-09-01 DIAGNOSIS — N390 Urinary tract infection, site not specified: Secondary | ICD-10-CM

## 2019-09-01 DIAGNOSIS — R319 Hematuria, unspecified: Secondary | ICD-10-CM | POA: Diagnosis not present

## 2019-09-01 LAB — URINALYSIS, COMPLETE (UACMP) WITH MICROSCOPIC
Glucose, UA: NEGATIVE mg/dL
Hgb urine dipstick: NEGATIVE
Ketones, ur: NEGATIVE mg/dL
Nitrite: POSITIVE — AB
Protein, ur: 30 mg/dL — AB
RBC / HPF: NONE SEEN RBC/hpf (ref 0–5)
Specific Gravity, Urine: 1.02 (ref 1.005–1.030)
pH: 6 (ref 5.0–8.0)

## 2019-09-01 MED ORDER — CEPHALEXIN 500 MG PO CAPS
500.0000 mg | ORAL_CAPSULE | Freq: Two times a day (BID) | ORAL | 0 refills | Status: AC
Start: 2019-09-01 — End: 2019-09-08

## 2019-09-01 NOTE — Discharge Instructions (Addendum)
Take medication as prescribed. Rest. Drink plenty of fluids.  ° °Follow up with your primary care physician this week as needed. Return to Urgent care for new or worsening concerns.  ° °

## 2019-09-01 NOTE — ED Triage Notes (Signed)
Patient complains of urinary frequency, painful urination x 1.5 weeks.

## 2019-09-01 NOTE — ED Provider Notes (Signed)
[facility] ____________________________________________  Time seen: Approximately 7:53 PM  I have reviewed the triage vital signs and the nursing notes.   HISTORY  Chief Complaint Urinary Frequency   HPI Sherri [name] is a 43 y.o. female presenting for evaluation of urinary frequency, urinary urgency and burning with urination present for the last 1.5 weeks, gradually worsening.  Denies vaginal discharge.  Denies abdominal pain, atypical back pain, nausea, vomiting or fevers.  Continues eat and drink well.  Has tried to increase fluids and cranberry juice at home without resolution.  Denies recent sickness.  Reports otherwise doing well.  Patient's last menstrual period was 08/16/2019.  Denies pregnancy.   History reviewed. No pertinent past medical history.  There are no active problems to display for this patient.   Past Surgical History:  Procedure Laterality Date  . CESAREAN SECTION    . TUBAL LIGATION       No current facility-administered medications for this encounter.   Current Outpatient Medications:  .  albuterol (PROVENTIL HFA;VENTOLIN HFA) 108 (90 Base) MCG/ACT inhaler, Inhale 2 puffs into the lungs every 4 (four) hours as needed for wheezing or shortness of breath., Disp: 1 Inhaler, Rfl: 2 .  amoxicillin-clavulanate (AUGMENTIN) 875-125 MG tablet, Take 1 tablet by mouth 2 (two) times daily., Disp: 14 tablet, Rfl: 0 .  azithromycin (ZITHROMAX) 250 MG tablet, Take 2 tabs now then 1 daily times 4 days, Disp: 6 tablet, Rfl: 0 .  benzonatate (TESSALON PERLES) 100 MG capsule, Take 1-2 capsules (100-200 mg total) by mouth every 8 (eight) hours as needed for cough., Disp: 30 capsule, Rfl: 0 .  cephALEXin (KEFLEX) 500 MG capsule, Take 1 capsule (500 mg total) by mouth 2 (two) times daily for 7 days., Disp: 14 capsule, Rfl: 0 .  fluticasone (FLONASE) 50 MCG/ACT nasal spray, Place 2 sprays into both nostrils daily., Disp: 16 g, Rfl: 0 .   HYDROcodone-homatropine (HYCODAN) 5-1.5 MG/5ML syrup, Take 5 mLs by mouth every 6 (six) hours as needed for cough., Disp: 120 mL, Rfl: 0 .  predniSONE (DELTASONE) 5 MG tablet, Take 1 tablet (5 mg total) by mouth as directed. Taper 6,5,4,3,2,1, Disp: 21 tablet, Rfl: 0  Allergies Codeine and Ivp dye [iodinated diagnostic agents]  History reviewed. No pertinent family history.  Social History Social History   Tobacco Use  . Smoking status: Never Smoker  . Smokeless tobacco: Never Used  Substance Use Topics  . Alcohol use: No  . Drug use: No    Review of Systems Constitutional: No fever ENT: No sore throat. Cardiovascular: Denies chest pain. Respiratory: Denies shortness of breath. Gastrointestinal: No abdominal pain.  No nausea, no vomiting.  No diarrhea.   Genitourinary: Positive for dysuria. Musculoskeletal: Negative for back pain. Skin: Negative for rash.   ____________________________________________   PHYSICAL EXAM:  VITAL SIGNS: ED Triage Vitals  Enc Vitals Group     BP 09/01/19 1828 123/77     Pulse Rate 09/01/19 1828 75     Resp 09/01/19 1828 16     Temp 09/01/19 1828 98.1 F (36.7 C)     Temp Source 09/01/19 1828 Oral     SpO2 09/01/19 1828 100 %     Weight 09/01/19 1825 178 lb (80.7 kg)     Height 09/01/19 1825 5\' 5"  (1.651 m)     Head Circumference --      Peak Flow --      Pain Score 09/01/19 1825 6     Pain Loc --  Pain Edu? --      Excl. in [place]? --     Constitutional: Alert and oriented. Well appearing and in no acute distress. Eyes: Conjunctivae are normal.  ENT      Head: Normocephalic and atraumatic. Cardiovascular: Normal rate, regular rhythm. Grossly normal heart sounds.  Good peripheral circulation. Respiratory: Normal respiratory effort without tachypnea nor retractions. Breath sounds are clear and equal bilaterally. No wheezes, rales, rhonchi. Gastrointestinal: Soft and nontender.No CVA tenderness. Musculoskeletal:  Steady gait.   Neurologic:  Normal speech and language.  Speech is normal. No gait instability.  Skin:  Skin is warm, dry and intact. No rash noted. Psychiatric: Mood and affect are normal. Speech and behavior are normal. Patient exhibits appropriate insight and judgment   ___________________________________________   LABS (all labs ordered are listed, but only abnormal results are displayed)  Labs Reviewed  URINALYSIS, COMPLETE (UACMP) WITH MICROSCOPIC - Abnormal; Notable for the following components:      Result Value   APPearance CLOUDY (*)    Bilirubin Urine SMALL (*)    Protein, ur 30 (*)    Nitrite POSITIVE (*)    Leukocytes,Ua SMALL (*)    Bacteria, UA MANY (*)    All other components within normal limits  URINE CULTURE   ____________________________________________   PROCEDURES Procedures    INITIAL IMPRESSION / ASSESSMENT AND PLAN / ED COURSE  Pertinent labs & imaging results that were available during my care of the patient were reviewed by me and considered in my medical decision making (see chart for details).  Well-appearing patient.  No acute distress.  Urinalysis reviewed, UTI.  We will culture and empirically start oral Keflex.  Encourage rest complaints, supportive care.Discussed indication, risks and benefits of medications with patient.   Discussed follow up and return parameters including no resolution or any worsening concerns. Patient verbalized understanding and agreed to plan.   ____________________________________________   FINAL CLINICAL IMPRESSION(S) / ED DIAGNOSES  Final diagnoses:  Urinary tract infection with hematuria, site unspecified     ED Discharge Orders         Ordered    cephALEXin (KEFLEX) 500 MG capsule  2 times daily     09/01/19 1907           Note: This dictation was prepared with Dragon dictation along with smaller phrase technology. Any transcriptional errors that result from this process are unintentional.         Renford Dills, NP 09/01/19 1958

## 2019-09-03 LAB — URINE CULTURE: Culture: >=100000 — AB

## 2019-09-04 ENCOUNTER — Telehealth (HOSPITAL_COMMUNITY): Payer: Self-pay | Admitting: Emergency Medicine

## 2019-09-04 NOTE — Telephone Encounter (Signed)
Urine culture was positive for KLEBSIELLA PNEUMONIAE and was given keflex  at urgent care visit. Attempted to reach patient. No answer at this time.  

## 2019-11-14 ENCOUNTER — Other Ambulatory Visit: Payer: Self-pay

## 2019-11-14 ENCOUNTER — Ambulatory Visit
Admission: EM | Admit: 2019-11-14 | Discharge: 2019-11-14 | Disposition: A | Discharge status: Home / Self Care | Payer: Commercial Managed Care - PPO | Attending: Family Medicine | Admitting: Family Medicine

## 2019-11-14 ENCOUNTER — Encounter: Payer: Self-pay | Admitting: Emergency Medicine

## 2019-11-14 DIAGNOSIS — J0100 Acute maxillary sinusitis, unspecified: Secondary | ICD-10-CM

## 2019-11-14 MED ORDER — AMOXICILLIN-POT CLAVULANATE 875-125 MG PO TABS: 1.0000 | ORAL_TABLET | Freq: Two times a day (BID) | ORAL | 0 refills | Status: DC

## 2019-11-14 NOTE — ED Triage Notes (Signed)
Patient in today c/o sinus pressure, congestion x 1.5 weeks. Patient states this is typical of a sinus infection for her. Patient has tried OTC sinus congestion without relief, getting worse.

## 2019-11-14 NOTE — ED Provider Notes (Signed)
[facility]    CSN: [id number] Arrival date & time: 11/14/19  4174  History   Chief Complaint Chief Complaint  Patient presents with  . Sinus Problem   HPI  44 year old female presents with sinus pressure and congestion.  1.5-week history of maxillary sinus pressure and pain.  Associated congestion.  Patient believes that she has a sinus infection.  Has been using over-the-counter medication without relief.  Symptoms worsening.  No fever.  No sick contacts.  No known exposure to COVID-19.  No known exacerbating factors.  No other associated symptoms.  No other complaints.  PMH, Surgical Hx, Family Hx, Social History reviewed and updated as below.  PMH: Hx of sinus infection.  Past Surgical History:  Procedure Laterality Date  . CESAREAN SECTION    . TUBAL LIGATION     OB History   No obstetric history on file.     Home Medications    Prior to Admission medications   Medication Sig Start Date End Date Taking? Authorizing Provider  fluticasone (FLONASE) 50 MCG/ACT nasal spray Place 2 sprays into both nostrils daily. 01/10/17  Yes Withrow, Elyse Jarvis, FNP  amoxicillin-clavulanate (AUGMENTIN) 875-125 MG tablet Take 1 tablet by mouth every 12 (twelve) hours. 11/14/19   Coral Spikes, DO  albuterol (PROVENTIL HFA;VENTOLIN HFA) 108 (90 Base) MCG/ACT inhaler Inhale 2 puffs into the lungs every 4 (four) hours as needed for wheezing or shortness of breath. 10/03/18 11/14/19  Withrow, Elyse Jarvis, FNP    Family History Family History  Problem Relation Age of Onset  . Healthy Mother   . Bladder Cancer Father   . Lung cancer Father   . Mitral valve prolapse Father     Social History Social History   Tobacco Use  . Smoking status: Never Smoker  . Smokeless tobacco: Never Used  Substance Use Topics  . Alcohol use: No  . Drug use: No     Allergies   Codeine and Ivp dye [iodinated diagnostic agents]   Review of Systems Review of Systems  Constitutional: Negative.    HENT: Positive for congestion, sinus pressure and sinus pain.    Physical Exam Triage Vital Signs ED Triage Vitals  Enc Vitals Group     BP 11/14/19 0847 138/84     Pulse Rate 11/14/19 0847 81     Resp 11/14/19 0847 18     Temp 11/14/19 0847 97.8 F (36.6 C)     Temp Source 11/14/19 0847 Oral     SpO2 11/14/19 0847 100 %     Weight 11/14/19 0848 160 lb (72.6 kg)     Height 11/14/19 0848 5\' 5"  (1.651 m)     Head Circumference --      Peak Flow --      Pain Score 11/14/19 0847 8     Pain Loc --      Pain Edu? --      Excl. in [place]? --    Updated Vital Signs BP 138/84 (BP Location: Left Arm)   Pulse 81   Temp 97.8 F (36.6 C) (Oral)   Resp 18   Ht 5\' 5"  (1.651 m)   Wt 72.6 kg   LMP 11/10/2019 (Approximate)   SpO2 100%   BMI 26.63 kg/m   Visual Acuity Right Eye Distance:   Left Eye Distance:   Bilateral Distance:    Right Eye Near:   Left Eye Near:    Bilateral Near:     Physical Exam Vitals and nursing  note reviewed.  Constitutional:      General: She is not in acute distress.    Appearance: Normal appearance. She is not ill-appearing.  HENT:     Head: Normocephalic and atraumatic.     Nose: Nose normal.     Comments: Maxillary sinus tenderness to palpation.    Mouth/Throat:     Pharynx: Oropharynx is clear. No posterior oropharyngeal erythema.  Eyes:     General:        Right eye: No discharge.        Left eye: No discharge.     Conjunctiva/sclera: Conjunctivae normal.  Cardiovascular:     Rate and Rhythm: Normal rate and regular rhythm.     Heart sounds: No murmur.  Pulmonary:     Effort: Pulmonary effort is normal.     Breath sounds: Normal breath sounds. No wheezing, rhonchi or rales.  Skin:    General: Skin is warm.     Findings: No rash.  Neurological:     Mental Status: She is alert.  Psychiatric:        Mood and Affect: Mood normal.        Behavior: Behavior normal.    UC Treatments / Results  Labs (all labs ordered are listed, but  only abnormal results are displayed) Labs Reviewed - No data to display  EKG   Radiology No results found.  Procedures Procedures (including critical care time)  Medications Ordered in UC Medications - No data to display  Initial Impression / Assessment and Plan / UC Course  I have reviewed the triage vital signs and the nursing notes.  Pertinent labs & imaging results that were available during my care of the patient were reviewed by me and considered in my medical decision making (see chart for details).    44 year old female presents with sinusitis.  Treating with Augmentin.  Patient declines Covid testing today.  Final Clinical Impressions(s) / UC Diagnoses   Final diagnoses:  Acute maxillary sinusitis, recurrence not specified   Discharge Instructions   None    ED Prescriptions    Medication Sig Dispense Auth. Provider   amoxicillin-clavulanate (AUGMENTIN) 875-125 MG tablet Take 1 tablet by mouth every 12 (twelve) hours. 20 tablet Tommie Sams, DO     PDMP not reviewed this encounter.   Everlene Other [place], [place] 11/14/19 [phone]

## 2020-01-19 ENCOUNTER — Telehealth: Payer: Self-pay

## 2020-01-19 NOTE — Telephone Encounter (Signed)
Not currently taking new patients unless she is family to another patient here.

## 2020-01-19 NOTE — Telephone Encounter (Signed)
Patient is requesting a New Patient appointment with Dr Patsy Lager. Please Advise is she will take her on as a New Patient ?  Patient made aware that she was not taking New Patients at this however she was referred by Co-Workers at [facility].

## 2020-01-22 ENCOUNTER — Telehealth: Payer: Self-pay

## 2020-01-22 NOTE — Telephone Encounter (Signed)
I am sorry, only taking family members of current patients right now

## 2020-01-22 NOTE — Telephone Encounter (Signed)
Sherri [name] and Roselind Rily recommend patient , Patient is a [employer].   Patient is requesting a New Patient appointment with Dr Patsy Lager. Please Advise is she will take her on as a New Patient ?  Patient made aware that she was not taking New Patients at this however she was referred by Co-Workers at [facility].

## 2020-03-22 ENCOUNTER — Encounter: Payer: Self-pay | Admitting: Family Medicine

## 2020-03-22 ENCOUNTER — Ambulatory Visit (INDEPENDENT_AMBULATORY_CARE_PROVIDER_SITE_OTHER): Payer: Commercial Managed Care - PPO | Admitting: Family Medicine

## 2020-03-22 ENCOUNTER — Encounter: Payer: Commercial Managed Care - PPO | Admitting: Family Medicine

## 2020-03-22 ENCOUNTER — Other Ambulatory Visit: Payer: Self-pay

## 2020-03-22 ENCOUNTER — Other Ambulatory Visit (HOSPITAL_COMMUNITY)
Admission: RE | Admit: 2020-03-22 | Discharge: 2020-03-22 | Disposition: A | Discharge status: Home / Self Care | Payer: Commercial Managed Care - PPO | Source: Ambulatory Visit | Attending: Family Medicine | Admitting: Family Medicine

## 2020-03-22 VITALS — BP 122/77 | HR 82 | Wt 188.2 lb

## 2020-03-22 DIAGNOSIS — Z124 Encounter for screening for malignant neoplasm of cervix: Secondary | ICD-10-CM | POA: Diagnosis not present

## 2020-03-22 DIAGNOSIS — Z01419 Encounter for gynecological examination (general) (routine) without abnormal findings: Secondary | ICD-10-CM | POA: Diagnosis not present

## 2020-03-22 DIAGNOSIS — Z98891 History of uterine scar from previous surgery: Secondary | ICD-10-CM

## 2020-03-22 DIAGNOSIS — E038 Other specified hypothyroidism: Secondary | ICD-10-CM | POA: Diagnosis not present

## 2020-03-22 DIAGNOSIS — N920 Excessive and frequent menstruation with regular cycle: Secondary | ICD-10-CM | POA: Diagnosis not present

## 2020-03-22 DIAGNOSIS — Z9851 Tubal ligation status: Secondary | ICD-10-CM

## 2020-03-22 DIAGNOSIS — E063 Autoimmune thyroiditis: Secondary | ICD-10-CM

## 2020-03-22 DIAGNOSIS — Z1211 Encounter for screening for malignant neoplasm of colon: Secondary | ICD-10-CM

## 2020-03-22 DIAGNOSIS — Z1231 Encounter for screening mammogram for malignant neoplasm of breast: Secondary | ICD-10-CM

## 2020-03-22 NOTE — Patient Instructions (Signed)
 Preventive Care 21-45 Years Old, Female Preventive care refers to visits with your health care provider and lifestyle choices that can promote health and wellness. This includes:  A yearly physical exam. This may also be called an annual well check.  Regular dental visits and eye exams.  Immunizations.  Screening for certain conditions.  Healthy lifestyle choices, such as eating a healthy diet, getting regular exercise, not using drugs or products that contain nicotine and tobacco, and limiting alcohol use. What can I expect for my preventive care visit? Physical exam Your health care provider will check your:  Height and weight. This may be used to calculate body mass index (BMI), which tells if you are at a healthy weight.  Heart rate and blood pressure.  Skin for abnormal spots. Counseling Your health care provider may ask you questions about your:  Alcohol, tobacco, and drug use.  Emotional well-being.  Home and relationship well-being.  Sexual activity.  Eating habits.  Work and work environment.  Method of birth control.  Menstrual cycle.  Pregnancy history. What immunizations do I need?  Influenza (flu) vaccine  This is recommended every year. Tetanus, diphtheria, and pertussis (Tdap) vaccine  You may need a Td booster every 10 years. Varicella (chickenpox) vaccine  You may need this if you have not been vaccinated. Human papillomavirus (HPV) vaccine  If recommended by your health care provider, you may need three doses over 6 months. Measles, mumps, and rubella (MMR) vaccine  You may need at least one dose of MMR. You may also need a second dose. Meningococcal conjugate (MenACWY) vaccine  One dose is recommended if you are age 19-21 years and a first-year college student living in a residence hall, or if you have one of several medical conditions. You may also need additional booster doses. Pneumococcal conjugate (PCV13) vaccine  You may need  this if you have certain conditions and were not previously vaccinated. Pneumococcal polysaccharide (PPSV23) vaccine  You may need one or two doses if you smoke cigarettes or if you have certain conditions. Hepatitis A vaccine  You may need this if you have certain conditions or if you travel or work in places where you may be exposed to hepatitis A. Hepatitis B vaccine  You may need this if you have certain conditions or if you travel or work in places where you may be exposed to hepatitis B. Haemophilus influenzae type b (Hib) vaccine  You may need this if you have certain conditions. You may receive vaccines as individual doses or as more than one vaccine together in one shot (combination vaccines). Talk with your health care provider about the risks and benefits of combination vaccines. What tests do I need?  Blood tests  Lipid and cholesterol levels. These may be checked every 5 years starting at age 20.  Hepatitis C test.  Hepatitis B test. Screening  Diabetes screening. This is done by checking your blood sugar (glucose) after you have not eaten for a while (fasting).  Sexually transmitted disease (STD) testing.  BRCA-related cancer screening. This may be done if you have a family history of breast, ovarian, tubal, or peritoneal cancers.  Pelvic exam and Pap test. This may be done every 3 years starting at age 21. Starting at age 30, this may be done every 5 years if you have a Pap test in combination with an HPV test. Talk with your health care provider about your test results, treatment options, and if necessary, the need for more   tests. Follow these instructions at home: Eating and drinking   Eat a diet that includes fresh fruits and vegetables, whole grains, lean protein, and low-fat dairy.  Take vitamin and mineral supplements as recommended by your health care provider.  Do not drink alcohol if: ? Your health care provider tells you not to drink. ? You are  pregnant, may be pregnant, or are planning to become pregnant.  If you drink alcohol: ? Limit how much you have to 0-1 drink a day. ? Be aware of how much alcohol is in your drink. In the [place] one drink equals one 12 oz bottle of beer (355 mL), one 5 oz glass of wine (148 mL), or one 1 oz glass of hard liquor (44 mL). Lifestyle  Take daily care of your teeth and gums.  Stay active. Exercise for at least 30 minutes on 5 or more days each week.  Do not use any products that contain nicotine or tobacco, such as cigarettes, e-cigarettes, and chewing tobacco. If you need help quitting, ask your health care provider.  If you are sexually active, practice safe sex. Use a condom or other form of birth control (contraception) in order to prevent pregnancy and STIs (sexually transmitted infections). If you plan to become pregnant, see your health care provider for a preconception visit. What's next?  Visit your health care provider once a year for a well check visit.  Ask your health care provider how often you should have your eyes and teeth checked.  Stay up to date on all vaccines. This information is not intended to replace advice given to you by your health care provider. Make sure you discuss any questions you have with your health care provider. Document Revised: 07/10/2018 Document Reviewed: 07/10/2018 Elsevier Patient Education  2020 Elsevier Inc.  

## 2020-03-22 NOTE — Assessment & Plan Note (Signed)
Has tried OCs, IUD to no avail. Has just recently resumed thyroid meds, and will wait to see if that helps correct her issues. If not, discussed option of endometrial ablation or hyst. Would need Prog prior to ablation. Briefly reviewed risks.

## 2020-03-22 NOTE — Progress Notes (Signed)
  Subjective:     Sherri [name] is a 44 y.o. G3 P89  female and is here for a comprehensive physical exam. The patient reports problems - abnormal bleeding. Having very heavy cycles and has tried multiple different things. Comes monthly, lasts 7 days. Has been on OCs and several different IUDs. Has been off her Synthroid for a while which might help. History of C-section with BTL. Recent separation. 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  The following portions of the patient's history were reviewed and updated as appropriate: allergies, current medications, past family history, past medical history, past social history, past surgical history and problem list.  Review of Systems Pertinent items noted in HPI and remainder of comprehensive ROS otherwise negative.   Objective:    BP 122/77   Pulse 82   Wt 188 lb 3.2 oz (85.4 kg)   LMP 03/18/2020   BMI 31.32 kg/m  General appearance: alert, cooperative and appears stated age Head: Normocephalic, without obvious abnormality, atraumatic Neck: no adenopathy, supple, symmetrical, trachea midline and thyroid not enlarged, symmetric, no tenderness/mass/nodules Lungs: clear to auscultation bilaterally Breasts: normal appearance, no masses or tenderness Heart: regular rate and rhythm, S1, S2 normal, no murmur, click, rub or gallop Abdomen: soft, non-tender; bowel sounds normal; no masses,  no organomegaly Pelvic: cervix normal in appearance, external genitalia normal, no adnexal masses or tenderness, no cervical motion tenderness, uterus normal size, shape, and consistency and vagina normal without discharge Extremities: extremities normal, atraumatic, no cyanosis or edema Pulses: 2+ and symmetric Skin: Skin color, texture, turgor normal. No rashes or lesions Lymph nodes: Cervical, supraclavicular, and axillary nodes normal. Neurologic: Grossly normal    Assessment:    Healthy female exam.      Plan:   Problem List Items Addressed This Visit       Unprioritized   Menorrhagia    Has tried OCs, IUD to no avail. Has just recently resumed thyroid meds, and will wait to see if that helps correct her issues. If not, discussed option of endometrial ablation or hyst. Would need Prog prior to ablation. Briefly reviewed risks.       Other Visit Diagnoses    Hypothyroidism due to Hashimoto's thyroiditis    -  Primary   Relevant Medications   levothyroxine (SYNTHROID) 25 MCG tablet   Other Relevant Orders   TSH   Screening for malignant neoplasm of cervix       Relevant Orders   Cytology - PAP( )   Special screening for malignant neoplasms, colon       Relevant Orders   MM 3D SCREEN BREAST BILATERAL   Encounter for screening mammogram for breast cancer       Relevant Orders   MM DIAG BREAST TOMO BILATERAL   US BREAST LTD UNI LEFT INC AXILLA   US BREAST LTD UNI RIGHT INC AXILLA     Return in 1 year (on 03/22/2021).    See After Visit Summary for Counseling Recommendations

## 2020-03-22 NOTE — Progress Notes (Signed)
Tubal ligation 12 years ago Mammogram several years ago

## 2020-03-23 LAB — TSH: TSH: 0.65 u[IU]/mL (ref 0.450–4.500)

## 2020-03-24 LAB — CYTOLOGY - PAP
Comment: NEGATIVE
Diagnosis: NEGATIVE
High risk HPV: NEGATIVE

## 2020-04-06 ENCOUNTER — Ambulatory Visit
Admission: RE | Admit: 2020-04-06 | Discharge: 2020-04-06 | Disposition: A | Discharge status: Home / Self Care | Payer: Commercial Managed Care - PPO | Source: Ambulatory Visit | Attending: Family Medicine | Admitting: Family Medicine

## 2020-04-06 DIAGNOSIS — R928 Other abnormal and inconclusive findings on diagnostic imaging of breast: Secondary | ICD-10-CM | POA: Insufficient documentation

## 2020-04-06 DIAGNOSIS — Z1231 Encounter for screening mammogram for malignant neoplasm of breast: Secondary | ICD-10-CM

## 2020-04-16 ENCOUNTER — Ambulatory Visit (INDEPENDENT_AMBULATORY_CARE_PROVIDER_SITE_OTHER): Payer: Commercial Managed Care - PPO

## 2020-04-16 ENCOUNTER — Encounter: Payer: Self-pay | Admitting: Emergency Medicine

## 2020-04-16 ENCOUNTER — Ambulatory Visit
Admission: EM | Admit: 2020-04-16 | Discharge: 2020-04-16 | Disposition: A | Discharge status: Home / Self Care | Payer: Commercial Managed Care - PPO | Attending: Family Medicine | Admitting: Family Medicine

## 2020-04-16 ENCOUNTER — Other Ambulatory Visit: Payer: Self-pay

## 2020-04-16 DIAGNOSIS — S93601A Unspecified sprain of right foot, initial encounter: Secondary | ICD-10-CM | POA: Diagnosis not present

## 2020-04-16 DIAGNOSIS — M8560 Other cyst of bone, unspecified site: Secondary | ICD-10-CM | POA: Diagnosis not present

## 2020-04-16 DIAGNOSIS — M19071 Primary osteoarthritis, right ankle and foot: Secondary | ICD-10-CM

## 2020-04-16 MED ORDER — PREDNISONE 10 MG PO TABS: ORAL_TABLET | ORAL | 0 refills | Status: DC

## 2020-04-16 MED ORDER — TRAMADOL HCL 50 MG PO TABS: ORAL_TABLET | ORAL | 0 refills | Status: DC

## 2020-04-16 NOTE — ED Triage Notes (Signed)
Patient c/o pain on the top and bottom of her right big toe that started 2 days ago.  Patient denies injury or fall.

## 2020-04-16 NOTE — Discharge Instructions (Addendum)
Rest, ice, over the counter analgesics as needed

## 2020-04-16 NOTE — ED Provider Notes (Signed)
[facility]    CSN: [id number] Arrival date & time: 04/16/20  1356      History   Chief Complaint Chief Complaint  Patient presents with  . Foot Pain    right    HPI Sherri [name] is a 44 y.o. female.   44 yo female with a c/o pain to her right foot for the past 2-3 days. States she's not sure if she may have twisted her foot about 4 days ago. Denies any redness, rash, fevers, chills, numbness/tingling.    Foot Pain    History reviewed. No pertinent past medical history.  Patient Active Problem List   Diagnosis Date Noted  . Menorrhagia 03/22/2020    Past Surgical History:  Procedure Laterality Date  . CESAREAN SECTION    . TUBAL LIGATION      OB History   No obstetric history on file.      Home Medications    Prior to Admission medications   Medication Sig Start Date End Date Taking? Authorizing Provider  levothyroxine (SYNTHROID) 25 MCG tablet Take 25 mcg by mouth every morning. 03/16/20  Yes [provider]  amoxicillin-clavulanate (AUGMENTIN) 875-125 MG tablet Take 1 tablet by mouth every 12 (twelve) hours. Patient not taking: Reported on 03/22/2020 11/14/19   Tommie Sams, DO  fluticasone [facility]) 50 MCG/ACT nasal spray Place 2 sprays into both nostrils daily. Patient not taking: Reported on 03/22/2020 01/10/17   Withrow, Everardo All, FNP  predniSONE (DELTASONE) 10 MG tablet Start 60 mg po day one, then 50 mg po day two, taper by 10 mg daily until complete. 04/16/20   Payton Mccallum, MD  traMADol (ULTRAM) 50 MG tablet 1-2 tabs po q 8 hours prn 04/16/20   Payton Mccallum, MD  albuterol (PROVENTIL HFA;VENTOLIN HFA) 108 (90 Base) MCG/ACT inhaler Inhale 2 puffs into the lungs every 4 (four) hours as needed for wheezing or shortness of breath. 10/03/18 11/14/19  Withrow, Everardo All, FNP    Family History Family History  Problem Relation Age of Onset  . Healthy Mother   . Bladder Cancer Father   . Lung cancer Father   . Mitral valve prolapse Father      Social History Social History   Tobacco Use  . Smoking status: Never Smoker  . Smokeless tobacco: Never Used  Substance Use Topics  . Alcohol use: No  . Drug use: No     Allergies   Codeine, Erythromycin, Ivp dye [iodinated diagnostic agents], and Morphine and related   Review of Systems Review of Systems   Physical Exam Triage Vital Signs ED Triage Vitals  Enc Vitals Group     BP 04/16/20 1410 119/68     Pulse Rate 04/16/20 1410 79     Resp 04/16/20 1410 14     Temp 04/16/20 1410 98 F (36.7 C)     Temp Source 04/16/20 1410 Oral     SpO2 04/16/20 1410 100 %     Weight 04/16/20 1407 175 lb (79.4 kg)     Height 04/16/20 1407 5\' 5"  (1.651 m)     Head Circumference --      Peak Flow --      Pain Score 04/16/20 1406 5     Pain Loc --      Pain Edu? --      Excl. in GC? --    No data found.  Updated Vital Signs BP 119/68 (BP Location: Right Arm)   Pulse 79  Temp 98 F (36.7 C) (Oral)   Resp 14   Ht 5\' 5"  (1.651 m)   Wt 79.4 kg   LMP 03/12/2020 (Approximate)   SpO2 100%   BMI 29.12 kg/m   Visual Acuity Right Eye Distance:   Left Eye Distance:   Bilateral Distance:    Right Eye Near:   Left Eye Near:    Bilateral Near:     Physical Exam Vitals and nursing note reviewed.  Constitutional:      General: She is not in acute distress.    Appearance: She is not toxic-appearing or diaphoretic.  Musculoskeletal:     Right foot: Normal range of motion and normal capillary refill. Swelling, bunion, tenderness and bony tenderness present. No deformity, Charcot foot, foot drop, prominent metatarsal heads, laceration or crepitus. Normal pulse.     Comments: Right foot neurovascularly intact  Neurological:     Mental Status: She is alert.      UC Treatments / Results  Labs (all labs ordered are listed, but only abnormal results are displayed) Labs Reviewed - No data to display  EKG   Radiology DG Foot Complete Right  Result Date:  04/16/2020 CLINICAL DATA:  Pain and swelling, first toe EXAM: RIGHT FOOT COMPLETE - 3+ VIEW COMPARISON:  None. FINDINGS: There is no evidence of fracture or dislocation. Subchondral cyst in the medial aspect of the first metatarsal head with overlying soft tissue swelling. Small calcaneal spur at the plantar aponeurosis. There is no other evidence of arthropathy or other focal bone abnormality. Soft tissues are unremarkable. IMPRESSION: 1. Negative for fracture or other acute bone abnormality. 2. Degenerative changes in the first metatarsal head with overlying soft tissue swelling. Electronically Signed   By: Lucrezia Europe M.D.   On: 04/16/2020 15:03    Procedures Procedures (including critical care time)  Medications Ordered in UC Medications - No data to display  Initial Impression / Assessment and Plan / UC Course  I have reviewed the triage vital signs and the nursing notes.  Pertinent labs & imaging results that were available during my care of the patient were reviewed by me and considered in my medical decision making (see chart for details).      Final Clinical Impressions(s) / UC Diagnoses   Final diagnoses:  Sprain of right foot, initial encounter  Degenerative arthritis of toe joint, right  Subchondral cyst     Discharge Instructions     Rest, ice, over the counter analgesics as needed    ED Prescriptions    Medication Sig Dispense Auth. Provider   predniSONE (DELTASONE) 10 MG tablet Start 60 mg po day one, then 50 mg po day two, taper by 10 mg daily until complete. 21 tablet Crislyn Willbanks, Linward Foster, MD   traMADol (ULTRAM) 50 MG tablet 1-2 tabs po q 8 hours prn 15 tablet Virgle Arth, Linward Foster, MD      1. x-ray results and diagnosis reviewed with patient 2. rx as per orders above; reviewed possible side effects, interactions, risks and benefits; (patient states she can take tramadol and doesn't have allergy to it)  3. Recommend supportive treatment as above 4. Follow-up prn if  symptoms worsen or don't improve   I have reviewed the PDMP during this encounter.   Norval Gable, MD 04/16/20 1536

## 2020-08-01 ENCOUNTER — Telehealth: Payer: Commercial Managed Care - PPO | Admitting: Family

## 2020-08-01 DIAGNOSIS — J019 Acute sinusitis, unspecified: Secondary | ICD-10-CM | POA: Diagnosis not present

## 2020-08-01 MED ORDER — AMOXICILLIN-POT CLAVULANATE 875-125 MG PO TABS: 1.0000 | ORAL_TABLET | Freq: Two times a day (BID) | ORAL | 0 refills | Status: DC

## 2020-08-01 NOTE — Progress Notes (Signed)

## 2020-08-24 ENCOUNTER — Telehealth: Payer: Commercial Managed Care - PPO | Admitting: Family

## 2020-08-24 DIAGNOSIS — J329 Chronic sinusitis, unspecified: Secondary | ICD-10-CM

## 2020-08-24 NOTE — Progress Notes (Signed)
Based on what you shared with me, I feel your condition warrants further evaluation and I recommend that you be seen for a face to face office visit.  Given you were just treated for a sinus infection on 08/01/20 you need to be seen face to face.   NOTE: If you entered your credit card information for this eVisit, you will not be charged. You may see a "hold" on your card for the $35 but that hold will drop off and you will not have a charge processed.   If you are having a true medical emergency please call 911.      For an urgent face to face visit, [facility] has five urgent care centers for your convenience:     [facility] at [facility] Directions [phone] [address] [place], [place] [address] . 10 am - 6pm Monday - Friday    [facility]) Get Driving Directions [phone] [address] [place], [place] [address] . 10 am to 8 pm Monday-Friday . 12 pm to 8 pm [facility]gent Care at [facility] Get Driving Directions [phone] [address], [address] [place], [place] [address] . 8 am to 8 pm Monday-Friday . 9 am to 6 pm Saturday . 11 am to 6 pm Sunday     [facility] at [facility] Get Driving Directions  [phone] [address] [place], [place] [address] . 8 am to 8 pm Monday-Friday . 8 am to 4 pm [facility] Urgent Care at [facility] Directions [phone] [address] [place], [place] [address] . 12 pm to 6 pm Monday-Friday      Your e-visit answers were reviewed by a board certified advanced clinical practitioner to complete your personal care plan.  Thank you for using e-Visits.

## 2020-09-15 ENCOUNTER — Encounter: Payer: Self-pay | Admitting: Family Medicine

## 2020-09-15 ENCOUNTER — Other Ambulatory Visit: Payer: Self-pay

## 2020-09-15 ENCOUNTER — Telehealth (INDEPENDENT_AMBULATORY_CARE_PROVIDER_SITE_OTHER): Payer: Commercial Managed Care - PPO | Admitting: Family Medicine

## 2020-09-15 DIAGNOSIS — N920 Excessive and frequent menstruation with regular cycle: Secondary | ICD-10-CM | POA: Diagnosis not present

## 2020-09-15 DIAGNOSIS — E038 Other specified hypothyroidism: Secondary | ICD-10-CM | POA: Diagnosis not present

## 2020-09-15 MED ORDER — NORETHINDRONE ACETATE 5 MG PO TABS: 10.0000 mg | ORAL_TABLET | Freq: Every day | ORAL | 2 refills | Status: DC

## 2020-09-15 NOTE — Progress Notes (Signed)
GYNECOLOGY VIRTUAL VISIT ENCOUNTER NOTE  Provider location: Center for [place] [facility]   I connected with Sherri [name] on 09/16/20 at  4:00 PM EDT by MyChart Video Encounter at home and verified that I am speaking with the correct person using two identifiers.   I discussed the limitations, risks, security and privacy concerns of performing an evaluation and management service virtually and the availability of in person appointments. I also discussed with the patient that there may be a patient responsible charge related to this service. The patient expressed understanding and agreed to proceed.   History:  Sherri [name] is a 44 y.o. No obstetric history on file. female being evaluated today for heavy cycles. Started on synthroid in May--her hair is thin and brittle and breaking off. ? Is she premenopausal. Having occasional night sweats and hot flashes Has gone through 72 tampons with one cycle. She is s/p BTL. Has had 2 prior c-sections and has previously been on OCs and tried an IUD none of which have controlled her cycles. Bleeding is not tolerable and interfering with ADL's. Wants to check hormone and vitamin levels. Lst TSH was WNL, and had restarted her synthroid. PCP explained that perhaps the synthroid was causing her symptoms. Has hot flashes and insomnia. She denies any abnormal vaginal discharge, pelvic pain or other concerns.       History reviewed. No pertinent past medical history. Past Surgical History:  Procedure Laterality Date  . CESAREAN SECTION    . TUBAL LIGATION     The following portions of the patient's history were reviewed and updated as appropriate: allergies, current medications, past family history, past medical history, past social history, past surgical history and problem list.   Health Maintenance:  Normal pap and negative HRHPV on 03/22/2020.  Normal mammogram on 04/06/2020.   Review of Systems:  Pertinent items noted in HPI and  remainder of comprehensive ROS otherwise negative.  Physical Exam:   General:  Alert, oriented and cooperative. Patient appears to be in no acute distress.  Mental Status: Normal mood and affect. Normal behavior. Normal judgment and thought content.   Respiratory: Normal respiratory effort, no problems with respiration noted  Rest of physical exam deferred due to type of encounter    Assessment and Plan:     Problem List Items Addressed This Visit      Unprioritized   Menorrhagia - Primary    For HTA with Aygestin to thin lining. S/p BTL in the past. Will check FSH (discussed limitation of testing) and TSH while checking an u/s. Risks include but are not limited to bleeding, infection, injury to surrounding structures, including bowel, bladder and ureters, blood clots, and death.  Likelihood of success is high.       Relevant Medications   norethindrone (AYGESTIN) 5 MG tablet   Other Relevant Orders   US PELVIC COMPLETE WITH TRANSVAGINAL   TSH   Follicle stimulating hormone   Hypothyroid    To consider Endocrinology referral.        Return in about 3 months (around 12/16/2020) for postop check.       I discussed the assessment and treatment plan with the patient. The patient was provided an opportunity to ask questions and all were answered. The patient agreed with the plan and demonstrated an understanding of the instructions.   The patient was advised to call back or seek an in-person evaluation/go to the ED if the symptoms worsen or if  the condition fails to improve as anticipated.  I provided 31 minutes of face-to-face time during this encounter.   Donnamae Jude, MD Center for [employer], [place]

## 2020-09-15 NOTE — Progress Notes (Signed)
Discuss hormones and her heavy periods, nothing has helped so far, possibly discuss ablation for bleeding   Hair is thinning as well.

## 2020-09-16 ENCOUNTER — Encounter: Payer: Self-pay | Admitting: Family Medicine

## 2020-09-16 DIAGNOSIS — E039 Hypothyroidism, unspecified: Secondary | ICD-10-CM | POA: Insufficient documentation

## 2020-09-16 NOTE — Assessment & Plan Note (Signed)
To consider Endocrinology referral.

## 2020-09-16 NOTE — Assessment & Plan Note (Signed)
For HTA with Aygestin to thin lining. S/p BTL in the past. Will check FSH (discussed limitation of testing) and TSH while checking an u/s. Risks include but are not limited to bleeding, infection, injury to surrounding structures, including bowel, bladder and ureters, blood clots, and death.  Likelihood of success is high.

## 2020-09-19 ENCOUNTER — Ambulatory Visit
Admission: RE | Admit: 2020-09-19 | Discharge: 2020-09-19 | Disposition: A | Discharge status: Home / Self Care | Payer: Commercial Managed Care - PPO | Source: Ambulatory Visit | Attending: Family Medicine | Admitting: Family Medicine

## 2020-09-19 ENCOUNTER — Other Ambulatory Visit: Payer: Commercial Managed Care - PPO

## 2020-09-19 ENCOUNTER — Other Ambulatory Visit: Payer: Self-pay

## 2020-09-19 ENCOUNTER — Encounter (HOSPITAL_BASED_OUTPATIENT_CLINIC_OR_DEPARTMENT_OTHER): Payer: Self-pay | Admitting: Family Medicine

## 2020-09-19 DIAGNOSIS — N920 Excessive and frequent menstruation with regular cycle: Secondary | ICD-10-CM | POA: Diagnosis present

## 2020-09-19 NOTE — H&P (Signed)
  Sherri [name] is an 44 y.o. No obstetric history on file. female.   Chief Complaint: abnormal bleeding HPI: has h/o abnormal bleeding not responsive to OC and IUD in the past. Desires more definitive treatment.  Past Medical History:  Diagnosis Date  . Abnormal uterine bleeding (AUB)   . Chronic back pain    lower back from fall summer 2021  . Complication of anesthesia    slow to awaken in past  . Hashimoto's thyroiditis 2020  . Hypothyroidism   . PONV (postoperative nausea and vomiting)    takes iv nausea med    Past Surgical History:  Procedure Laterality Date  . CESAREAN SECTION  2003   x 1  . TONSILLECTOMY  1990's  . TUBAL LIGATION  2003    Family History  Problem Relation Age of Onset  . Healthy Mother   . Bladder Cancer Father   . Lung cancer Father   . Mitral valve prolapse Father    Social History:  reports that she has never smoked. She has never used smokeless tobacco. She reports that she does not drink alcohol and does not use drugs.  Allergies:  Allergies  Allergen Reactions  . Codeine     Nauesa, vomit, hives  . Erythromycin Hives  . Ivp Dye [Iodinated Diagnostic Agents]     Feel like throat closing, itching nausea  . Morphine And Related Other (See Comments)    Nausea vomit hives    No medications prior to admission.    A comprehensive review of systems was negative.  Height 5\' 5"  (1.651 m), weight 86.2 kg, last menstrual period 08/23/2020. General appearance: alert, cooperative and appears stated age Head: Normocephalic, without obvious abnormality, atraumatic Neck: supple, symmetrical, trachea midline Lungs: normal effort Heart: regular rate and rhythm Abdomen: soft, non-tender; bowel sounds normal; no masses,  no organomegaly Extremities: Homans sign is negative, no sign of DVT Skin: Skin color, texture, turgor normal. No rashes or lesions Neurologic: Grossly normal   No results found for: WBC, HGB, HCT, MCV, PLT Lab Results   Component Value Date   PREGTESTUR Negative 02/28/2015     Assessment/Plan Principal Problem:   Menorrhagia  For D & C with hysteroscopy and HTA Risks include but are not limited to bleeding, infection, injury to surrounding structures, including bowel, bladder and ureters, blood clots, and death.  Likelihood of success is high.    03/02/2015 09/19/2020, 5:35 PM

## 2020-09-19 NOTE — Progress Notes (Signed)
Spoke w/ via phone for pre-op interview---pt Lab needs dos----urine poct               Lab results------follicle stimulating hormone and tsh done 09-19-2020 epic COVID test ------09-26-2020 1000 am [facility] Arrive at -------930 am 09-28-2020 NPO after MN NO Solid Food.  Clear liquids from MN until---830 am then npo Medications to take morning of surgery -----levothyroxine Diabetic medication -----n/a Patient Special Instructions -----none Pre-Op special Istructions -----none Patient verbalized understanding of instructions that were given at this phone interview. Patient denies shortness of breath, chest pain, fever, cough at this phone interview.

## 2020-09-20 LAB — TSH: TSH: 1.76 u[IU]/mL (ref 0.450–4.500)

## 2020-09-20 LAB — FOLLICLE STIMULATING HORMONE: FSH: 3.7 m[IU]/mL

## 2020-09-21 ENCOUNTER — Telehealth: Payer: Commercial Managed Care - PPO | Admitting: Family

## 2020-09-21 DIAGNOSIS — J019 Acute sinusitis, unspecified: Secondary | ICD-10-CM | POA: Diagnosis not present

## 2020-09-21 MED ORDER — AMOXICILLIN-POT CLAVULANATE 875-125 MG PO TABS: 1.0000 | ORAL_TABLET | Freq: Two times a day (BID) | ORAL | 0 refills | Status: DC

## 2020-09-21 NOTE — Progress Notes (Signed)

## 2020-09-26 ENCOUNTER — Other Ambulatory Visit (HOSPITAL_COMMUNITY): Payer: Commercial Managed Care - PPO

## 2020-09-26 ENCOUNTER — Other Ambulatory Visit
Admission: RE | Admit: 2020-09-26 | Discharge: 2020-09-26 | Disposition: A | Discharge status: Home / Self Care | Payer: Commercial Managed Care - PPO | Source: Ambulatory Visit | Attending: Family Medicine | Admitting: Family Medicine

## 2020-09-26 ENCOUNTER — Other Ambulatory Visit: Payer: Self-pay

## 2020-09-26 DIAGNOSIS — Z01812 Encounter for preprocedural laboratory examination: Secondary | ICD-10-CM | POA: Diagnosis not present

## 2020-09-26 DIAGNOSIS — Z20822 Contact with and (suspected) exposure to covid-19: Secondary | ICD-10-CM | POA: Insufficient documentation

## 2020-09-27 LAB — SARS CORONAVIRUS 2 (TAT 6-24 HRS): SARS Coronavirus 2: NEGATIVE

## 2020-09-28 ENCOUNTER — Ambulatory Visit (HOSPITAL_BASED_OUTPATIENT_CLINIC_OR_DEPARTMENT_OTHER): Payer: Commercial Managed Care - PPO | Admitting: Anesthesiology

## 2020-09-28 ENCOUNTER — Ambulatory Visit (HOSPITAL_BASED_OUTPATIENT_CLINIC_OR_DEPARTMENT_OTHER)
Admission: RE | Admit: 2020-09-28 | Discharge: 2020-09-28 | Disposition: A | Discharge status: Home / Self Care | Payer: Commercial Managed Care - PPO | Source: Ambulatory Visit | Attending: Family Medicine | Admitting: Family Medicine

## 2020-09-28 ENCOUNTER — Other Ambulatory Visit: Payer: Self-pay

## 2020-09-28 ENCOUNTER — Encounter (HOSPITAL_BASED_OUTPATIENT_CLINIC_OR_DEPARTMENT_OTHER): Payer: Self-pay | Admitting: Family Medicine

## 2020-09-28 ENCOUNTER — Encounter (HOSPITAL_BASED_OUTPATIENT_CLINIC_OR_DEPARTMENT_OTHER)
Admission: RE | Disposition: A | Discharge status: Home / Self Care | Payer: Self-pay | Source: Ambulatory Visit | Attending: Family Medicine

## 2020-09-28 DIAGNOSIS — N920 Excessive and frequent menstruation with regular cycle: Secondary | ICD-10-CM | POA: Insufficient documentation

## 2020-09-28 DIAGNOSIS — N939 Abnormal uterine and vaginal bleeding, unspecified: Secondary | ICD-10-CM | POA: Insufficient documentation

## 2020-09-28 DIAGNOSIS — Z885 Allergy status to narcotic agent status: Secondary | ICD-10-CM | POA: Diagnosis not present

## 2020-09-28 DIAGNOSIS — Z881 Allergy status to other antibiotic agents status: Secondary | ICD-10-CM | POA: Insufficient documentation

## 2020-09-28 HISTORY — PX: DILITATION & CURRETTAGE/HYSTROSCOPY WITH HYDROTHERMAL ABLATION: SHX5570

## 2020-09-28 HISTORY — DX: Other complications of anesthesia, initial encounter: T88.59XA

## 2020-09-28 HISTORY — DX: Nausea with vomiting, unspecified: R11.2

## 2020-09-28 HISTORY — DX: Other chronic pain: G89.29

## 2020-09-28 HISTORY — DX: Abnormal uterine and vaginal bleeding, unspecified: N93.9

## 2020-09-28 HISTORY — DX: Hypothyroidism, unspecified: E03.9

## 2020-09-28 HISTORY — DX: Other specified postprocedural states: Z98.890

## 2020-09-28 LAB — CBC
HCT: 39.3 % (ref 36.0–46.0)
Hemoglobin: 13.7 g/dL (ref 12.0–15.0)
MCH: 30.8 pg (ref 26.0–34.0)
MCHC: 34.9 g/dL (ref 30.0–36.0)
MCV: 88.3 fL (ref 80.0–100.0)
Platelets: 307 10*3/uL (ref 150–400)
RBC: 4.45 MIL/uL (ref 3.87–5.11)
RDW: 13.2 % (ref 11.5–15.5)
WBC: 5.9 10*3/uL (ref 4.0–10.5)
nRBC: 0 % (ref 0.0–0.2)

## 2020-09-28 LAB — POCT PREGNANCY, URINE: Preg Test, Ur: NEGATIVE

## 2020-09-28 SURGERY — DILATATION & CURETTAGE/HYSTEROSCOPY WITH HYDROTHERMAL ABLATION
Anesthesia: General | Site: Vagina

## 2020-09-28 MED ORDER — ACETAMINOPHEN 500 MG PO TABS
1000.0000 mg | ORAL_TABLET | ORAL | Status: AC
  Administered 2020-09-28: 1000 mg via ORAL

## 2020-09-28 MED ORDER — SCOPOLAMINE 1 MG/3DAYS TD PT72
MEDICATED_PATCH | TRANSDERMAL | Status: DC | PRN
  Administered 2020-09-28: 1 via TRANSDERMAL

## 2020-09-28 MED ORDER — ONDANSETRON HCL 4 MG/2ML IJ SOLN: 4.0000 mg | Freq: Once | INTRAMUSCULAR | Status: DC | PRN

## 2020-09-28 MED ORDER — MIDAZOLAM HCL 2 MG/2ML IJ SOLN
INTRAMUSCULAR | Status: DC | PRN
  Administered 2020-09-28: 2 mg via INTRAVENOUS

## 2020-09-28 MED ORDER — MEPERIDINE HCL 25 MG/ML IJ SOLN: 6.2500 mg | INTRAMUSCULAR | Status: DC | PRN

## 2020-09-28 MED ORDER — LACTATED RINGERS IV SOLN: INTRAVENOUS | Status: DC

## 2020-09-28 MED ORDER — PROPOFOL 10 MG/ML IV BOLUS
INTRAVENOUS | Status: DC | PRN
  Administered 2020-09-28: 200 mg via INTRAVENOUS

## 2020-09-28 MED ORDER — GABAPENTIN 300 MG PO CAPS
300.0000 mg | ORAL_CAPSULE | ORAL | Status: AC
  Administered 2020-09-28: 300 mg via ORAL

## 2020-09-28 MED ORDER — LIDOCAINE HCL (CARDIAC) PF 100 MG/5ML IV SOSY
PREFILLED_SYRINGE | INTRAVENOUS | Status: DC | PRN
  Administered 2020-09-28: 60 mg via INTRAVENOUS

## 2020-09-28 MED ORDER — DEXAMETHASONE SODIUM PHOSPHATE 4 MG/ML IJ SOLN
INTRAMUSCULAR | Status: DC | PRN
  Administered 2020-09-28: 8 mg via INTRAVENOUS

## 2020-09-28 MED ORDER — PROPOFOL 500 MG/50ML IV EMUL
INTRAVENOUS | Status: DC | PRN
  Administered 2020-09-28: 150 ug/kg/min via INTRAVENOUS

## 2020-09-28 MED ORDER — KETOROLAC TROMETHAMINE 30 MG/ML IJ SOLN
INTRAMUSCULAR | Status: DC | PRN
  Administered 2020-09-28: 30 mg via INTRAVENOUS

## 2020-09-28 MED ORDER — ONDANSETRON HCL 4 MG/2ML IJ SOLN
INTRAMUSCULAR | Status: DC | PRN
  Administered 2020-09-28: 4 mg via INTRAVENOUS

## 2020-09-28 MED ORDER — LIDOCAINE HCL 1 % IJ SOLN
INTRAMUSCULAR | Status: DC | PRN
  Administered 2020-09-28: 20 mL

## 2020-09-28 MED ORDER — ACETAMINOPHEN 500 MG PO TABS
ORAL_TABLET | ORAL | Status: AC
  Filled 2020-09-28: qty 2

## 2020-09-28 MED ORDER — SODIUM CHLORIDE 0.9 % IR SOLN
Status: DC | PRN
  Administered 2020-09-28: 3000 mL

## 2020-09-28 MED ORDER — FENTANYL CITRATE (PF) 100 MCG/2ML IJ SOLN
25.0000 ug | INTRAMUSCULAR | Status: DC | PRN
  Administered 2020-09-28 (×2): 25 ug via INTRAVENOUS

## 2020-09-28 MED ORDER — OXYCODONE HCL 5 MG PO TABS: 5.0000 mg | ORAL_TABLET | Freq: Once | ORAL | Status: DC | PRN

## 2020-09-28 MED ORDER — ACETAMINOPHEN 325 MG PO TABS: 325.0000 mg | ORAL_TABLET | ORAL | Status: DC | PRN

## 2020-09-28 MED ORDER — CELECOXIB 200 MG PO CAPS
ORAL_CAPSULE | ORAL | Status: AC
  Filled 2020-09-28: qty 2

## 2020-09-28 MED ORDER — FENTANYL CITRATE (PF) 100 MCG/2ML IJ SOLN
INTRAMUSCULAR | Status: AC
  Filled 2020-09-28: qty 2

## 2020-09-28 MED ORDER — OXYCODONE HCL 5 MG/5ML PO SOLN: 5.0000 mg | Freq: Once | ORAL | Status: DC | PRN

## 2020-09-28 MED ORDER — FENTANYL CITRATE (PF) 100 MCG/2ML IJ SOLN
INTRAMUSCULAR | Status: DC | PRN
  Administered 2020-09-28: 50 ug via INTRAVENOUS

## 2020-09-28 MED ORDER — ACETAMINOPHEN 160 MG/5ML PO SOLN: 325.0000 mg | ORAL | Status: DC | PRN

## 2020-09-28 MED ORDER — GABAPENTIN 300 MG PO CAPS
ORAL_CAPSULE | ORAL | Status: AC
  Filled 2020-09-28: qty 1

## 2020-09-28 MED ORDER — POVIDONE-IODINE 10 % EX SWAB: 2.0000 "application " | Freq: Once | CUTANEOUS | Status: DC

## 2020-09-28 MED ORDER — CELECOXIB 200 MG PO CAPS
400.0000 mg | ORAL_CAPSULE | ORAL | Status: AC
  Administered 2020-09-28: 400 mg via ORAL

## 2020-09-28 MED ORDER — OXYCODONE HCL 5 MG PO TABS: 5.0000 mg | ORAL_TABLET | Freq: Four times a day (QID) | ORAL | 0 refills | Status: DC | PRN

## 2020-09-28 SURGICAL SUPPLY — 16 items
CANISTER SUCT 3000ML PPV (MISCELLANEOUS) ×1 IMPLANT
CATH ROBINSON RED A/P 16FR (CATHETERS) ×2 IMPLANT
COVER WAND RF STERILE (DRAPES) ×2 IMPLANT
DILATOR CANAL MILEX (MISCELLANEOUS) IMPLANT
GAUZE 4X4 16PLY RFD (DISPOSABLE) ×2 IMPLANT
GLOVE BIOGEL PI IND STRL 7.0 (GLOVE) ×1 IMPLANT
GLOVE BIOGEL PI INDICATOR 7.0 (GLOVE) ×1
GLOVE ECLIPSE 7.0 STRL STRAW (GLOVE) ×2 IMPLANT
GOWN STRL REUS W/TWL XL LVL3 (GOWN DISPOSABLE) ×2 IMPLANT
IV NS IRRIG 3000ML ARTHROMATIC (IV SOLUTION) ×1 IMPLANT
KIT TURNOVER CYSTO (KITS) ×2 IMPLANT
PACK VAGINAL MINOR WOMEN LF (CUSTOM PROCEDURE TRAY) ×2 IMPLANT
PAD OB MATERNITY 4.3X12.25 (PERSONAL CARE ITEMS) ×2 IMPLANT
PAD PREP 24X48 CUFFED NSTRL (MISCELLANEOUS) ×2 IMPLANT
SET GENESYS HTA PROCERVA (MISCELLANEOUS) ×2 IMPLANT
TOWEL OR 17X26 10 PK STRL BLUE (TOWEL DISPOSABLE) ×3 IMPLANT

## 2020-09-28 NOTE — Discharge Instructions (Signed)
Endometrial Ablation, Care After This sheet gives you information about how to care for yourself after your procedure. Your health care provider may also give you more specific instructions. If you have problems or questions, contact your health care provider. What can I expect after the procedure? After the procedure, it is common to have:  A need to urinate more frequently than usual for the first 24 hours.  Cramps similar to menstrual cramps. These may last for 1-2 days.  Thin, watery vaginal discharge that is light pink or brown in color. This may last a few weeks. Discharge will be heavy for the first few days after your procedure. You may need to wear a sanitary pad.  Nausea.  Vaginal bleeding for 4-6 weeks after the procedure, as tissue healing occurs. Follow these instructions at home: Activity  Do not drive for 24 hours if you were given amedicine to help you relax (sedative) during your procedure.  Do not have sex or put anything into your vagina until your health care provider approves.  Do not lift anything that is heavier than 10 lb (4.5 kg), or the limit that you are told, until your health care provider says that it is safe.  Return to your normal activities as told by your health care provider. Ask your health care provider what activities are safe for you. General instructions   Take over-the-counter and prescription medicines only as told by your health care provider.  Do not take baths, swim, or use a hot tub until your health care provider approves. You will be able to take showers.  Check your vaginal area every day for signs of infection. Check for: ? Redness, swelling, or pain. ? More discharge or blood, instead of less. ? Bad-smelling discharge.  Keep all follow-up visits as told by your health care provider. This is important.  Drink enough fluid to keep your urine pale yellow. Contact a health care provider if you have:  Vaginal redness, swelling, or  pain.  Vaginal discharge or bleeding that gets worse instead of getting better.  Bad-smelling vaginal discharge.  A fever or chills.  Trouble urinating. Get help right away if you have:  Heavy vaginal bleeding.  Severe cramps. Summary  After endometrial ablation, it is normal to have thin, watery vaginal discharge that is light pink or brown in color. This may last a few weeks and may be heavier right after the procedure.  Vaginal bleeding is also normal after the procedure and should get better with time.  Check your vaginal area every day for signs of infection, such as bad-smelling discharge.  Keep all follow-up visits as told by your health care provider. This is important. This information is not intended to replace advice given to you by your health care provider. Make sure you discuss any questions you have with your health care provider. Document Revised: 02/19/2019 Document Reviewed: 09/10/2017 Elsevier Patient Education  2020 Elsevier Inc.   Post Anesthesia Home Care Instructions  Activity: Get plenty of rest for the remainder of the day. A responsible adult should stay with you for 24 hours following the procedure.  For the next 24 hours, DO NOT: -Drive a car -Operate machinery -Drink alcoholic beverages -Take any medication unless instructed by your physician -Make any legal decisions or sign important papers.  Meals: Start with liquid foods such as gelatin or soup. Progress to regular foods as tolerated. Avoid greasy, spicy, heavy foods. If nausea and/or vomiting occur, drink only clear liquids until the   nausea and/or vomiting subsides. Call your physician if vomiting continues.  Special Instructions/Symptoms: Your throat may feel dry or sore from the anesthesia or the breathing tube placed in your throat during surgery. If this causes discomfort, gargle with warm salt water. The discomfort should disappear within 24 hours.  If you had a scopolamine patch  placed behind your ear for the management of post- operative nausea and/or vomiting:  1. The medication in the patch is effective for 72 hours, after which it should be removed.  Wrap patch in a tissue and discard in the trash. Wash hands thoroughly with soap and water. 2. You may remove the patch earlier than 72 hours if you experience unpleasant side effects which may include dry mouth, dizziness or visual disturbances. 3. Avoid touching the patch. Wash your hands with soap and water after contact with the patch.    

## 2020-09-28 NOTE — Op Note (Signed)
Preoperative diagnosis: Menorrhagia  Postoperative diagnosis: Menorrhagia  Procedure: HTA endometrial ablation, hysteroscopy, D & C  Surgeon: Standley Dakins. Kennon Rounds, M.D.  Anesthesia: MAC, Janeece Riggers, MD Paracervical block  Findings: Normal appearing cervix and uterine cavity with both ostia seen, possible polyp.  Estimated blood loss: Minimal  Specimens: Endometrial curettings  Disposition of specimen: Pathology  Reason for procedure: Patient is a 44 y.o.with h/o abnormal bleeding unresponsive to OCs and IUD.   Procedure: Patient was taken to the OR where she was placed in dorsal lithotomy in [place]. SCDs were in place. An adequate timeout was obtained. The patient was prepped and draped in the usual sterile fashion. A red rubber catheter is used to drain her bladder. A speculum was placed inside the vagina and the cervix visualized. The cervix was grasped anteriorly with a single-tooth tenaculum. 20 cc of one percent Lidocaine were injected for paracervical block. The uterus sounded to 10 cm. Sequential dilation was done to a #21 dilator, and the HTA with hysteroscope was introduced into the uterine cavity. The cervix and endometrial lining appeared normal both ostia were seen there was no deformity of the cavity. A seal test was done and was passed. The uterine cavity was heated to between 80 and 90C and HTA was performed for 10 minutes. Cooling was then performed and the instrument removed. Sharp curettage was then performed and sample sent to pathology. All instrument, needle, and lap counts were correct x2. The patient was awakened taken to recovery room in stable condition.  Donnamae Jude MD 09/28/2020 11:22 AM

## 2020-09-28 NOTE — Anesthesia Postprocedure Evaluation (Signed)
Anesthesia Post Note  Patient: Sherri [name]  Procedure(s) Performed: DILATATION & CURETTAGE/HYSTEROSCOPY WITH HYDROTHERMAL ABLATION (N/A Vagina )     Patient location during evaluation: PACU Anesthesia Type: General Level of consciousness: awake and alert Pain management: pain level controlled Vital Signs Assessment: post-procedure vital signs reviewed and stable Respiratory status: spontaneous breathing, nonlabored ventilation, respiratory function stable and patient connected to nasal cannula oxygen Cardiovascular status: blood pressure returned to baseline and stable Postop Assessment: no apparent nausea or vomiting Anesthetic complications: no   No complications documented.  Last Vitals:  Vitals:   09/28/20 1215 09/28/20 1230  BP: (!) 166/96 (!) 165/88  Pulse:    Resp:    Temp:    SpO2:      Last Pain:  Vitals:   09/28/20 1200  TempSrc:   PainSc: 8                  Mariza Bourget

## 2020-09-28 NOTE — Anesthesia Preprocedure Evaluation (Signed)
Anesthesia Evaluation  Patient identified by MRN, date of birth, ID band Patient awake    Reviewed: Allergy & Precautions, H&P , NPO status , Patient's Chart, lab work & pertinent test results, reviewed documented beta blocker date and time   History of Anesthesia Complications (+) PONV, PROLONGED EMERGENCE and history of anesthetic complications  Airway Mallampati: II  TM Distance: >3 FB Neck ROM: full    Dental no notable dental hx.    Pulmonary neg pulmonary ROS,    Pulmonary exam normal breath sounds clear to auscultation       Cardiovascular Exercise Tolerance: Good negative cardio ROS   Rhythm:regular Rate:Normal     Neuro/Psych negative neurological ROS  negative psych ROS   GI/Hepatic negative GI ROS, Neg liver ROS,   Endo/Other  Hypothyroidism Morbid obesity  Renal/GU negative Renal ROS  negative genitourinary   Musculoskeletal   Abdominal   Peds  Hematology negative hematology ROS (+)   Anesthesia Other Findings   Reproductive/Obstetrics negative OB ROS                             Anesthesia Physical Anesthesia Plan  ASA: II  Anesthesia Plan: General   Post-op Pain Management:    Induction: Intravenous  PONV Risk Score and Plan: 3 and Ondansetron, Dexamethasone, Treatment may vary due to age or medical condition, TIVA and Scopolamine patch - Pre-op  Airway Management Planned: LMA  Additional Equipment:   Intra-op Plan:   Post-operative Plan:   Informed Consent: I have reviewed the patients History and Physical, chart, labs and discussed the procedure including the risks, benefits and alternatives for the proposed anesthesia with the patient or authorized representative who has indicated his/her understanding and acceptance.     Dental Advisory Given  Plan Discussed with: CRNA and Anesthesiologist  Anesthesia Plan Comments: ( )        Anesthesia  Quick Evaluation

## 2020-09-28 NOTE — Anesthesia Procedure Notes (Signed)
Procedure Name: LMA Insertion Date/Time: 09/28/2020 10:41 AM Performed by: Earmon Phoenix, CRNA Pre-anesthesia Checklist: Patient identified, Emergency Drugs available, Suction available, Patient being monitored and Timeout performed Patient Re-evaluated:Patient Re-evaluated prior to induction Oxygen Delivery Method: Circle system utilized Preoxygenation: Pre-oxygenation with 100% oxygen Induction Type: IV induction Ventilation: Mask ventilation without difficulty LMA: LMA inserted LMA Size: 4.0 Number of attempts: 1 Placement Confirmation: positive ETCO2,  CO2 detector and breath sounds checked- equal and bilateral Tube secured with: Tape Dental Injury: Teeth and Oropharynx as per pre-operative assessment

## 2020-09-28 NOTE — Interval H&P Note (Signed)
History and Physical Interval Note:  09/28/2020 10:01 AM  Sherri [name]  has presented today for surgery, with the diagnosis of AUB.  The various methods of treatment have been discussed with the patient and family. After consideration of risks, benefits and other options for treatment, the patient has consented to  Procedure(s): DILATATION & CURETTAGE/HYSTEROSCOPY WITH HYDROTHERMAL ABLATION (N/A) as a surgical intervention.  The patient's history has been reviewed, patient examined, no change in status, stable for surgery.  I have reviewed the patient's chart and labs.  Questions were answered to the patient's satisfaction.     Reva Bores

## 2020-09-28 NOTE — Transfer of Care (Signed)
Immediate Anesthesia Transfer of Care Note  Patient: Sherri [name]  Procedure(s) Performed: DILATATION & CURETTAGE/HYSTEROSCOPY WITH HYDROTHERMAL ABLATION (N/A Vagina )  Patient Location: PACU  Anesthesia Type:General  Level of Consciousness: awake and patient cooperative  Airway & Oxygen Therapy: Patient Spontanous Breathing  Post-op Assessment: Report given to RN and Post -op Vital signs reviewed and stable  Post vital signs: Reviewed and stable  Last Vitals:  Vitals Value Taken Time  BP    Temp    Pulse 67 09/28/20 1140  Resp 16 09/28/20 1140  SpO2 100 % 09/28/20 1140  Vitals shown include unvalidated device data.  Last Pain:  Vitals:   09/28/20 0953  TempSrc: Oral  PainSc: 0-No pain         Complications: No complications documented.

## 2020-09-29 ENCOUNTER — Encounter (HOSPITAL_BASED_OUTPATIENT_CLINIC_OR_DEPARTMENT_OTHER): Payer: Self-pay | Admitting: Family Medicine

## 2020-09-29 ENCOUNTER — Encounter: Payer: Self-pay | Admitting: Radiology

## 2020-09-29 LAB — SURGICAL PATHOLOGY

## 2020-10-18 ENCOUNTER — Other Ambulatory Visit: Payer: Self-pay

## 2020-10-18 ENCOUNTER — Telehealth (INDEPENDENT_AMBULATORY_CARE_PROVIDER_SITE_OTHER): Payer: Commercial Managed Care - PPO | Admitting: Family Medicine

## 2020-10-18 ENCOUNTER — Encounter: Payer: Self-pay | Admitting: Family Medicine

## 2020-10-18 DIAGNOSIS — N920 Excessive and frequent menstruation with regular cycle: Secondary | ICD-10-CM

## 2020-10-18 DIAGNOSIS — Z09 Encounter for follow-up examination after completed treatment for conditions other than malignant neoplasm: Secondary | ICD-10-CM

## 2020-10-18 NOTE — Progress Notes (Signed)
GYNECOLOGY VIRTUAL VISIT ENCOUNTER NOTE  Provider location: Center for [facility]   I connected with Andree Coss on 10/18/20 at  1:45 PM EST by MyChart Video Encounter at home and verified that I am speaking with the correct person using two identifiers.   I discussed the limitations, risks, security and privacy concerns of performing an evaluation and management service virtually and the availability of in person appointments. I also discussed with the patient that there may be a patient responsible charge related to this service. The patient expressed understanding and agreed to proceed.   History:  Sherri [name] is a 44 y.o. No obstetric history on file. female being evaluated today for postop check. She is s/p HTA on 09/28/20. She had a bleeding episode that lasted about 1-2 weeks, now it is only spotting. Did have some foul-smelling discharge. She denies any abnormal vaginal discharge, pelvic pain or other concerns.       Past Medical History:  Diagnosis Date  . Abnormal uterine bleeding (AUB)   . Chronic back pain    lower back from fall summer 2021  . Complication of anesthesia    slow to awaken in past  . Hashimoto's thyroiditis 2020  . Hypothyroidism   . PONV (postoperative nausea and vomiting)    takes iv nausea med   Past Surgical History:  Procedure Laterality Date  . CESAREAN SECTION  2003   x 1  . DILITATION & CURRETTAGE/HYSTROSCOPY WITH HYDROTHERMAL ABLATION N/A 09/28/2020   Procedure: DILATATION & CURETTAGE/HYSTEROSCOPY WITH HYDROTHERMAL ABLATION;  Surgeon: Reva Bores, MD;  Location: [facility] ;  Service: Gynecology;  Laterality: N/A;  . TONSILLECTOMY  1990's  . TUBAL LIGATION  2003   The following portions of the patient's history were reviewed and updated as appropriate: allergies, current medications, past family history, past medical history, past social history, past surgical history and problem list.    Health Maintenance:  Normal pap and negative HRHPV on 03/22/2020.  Normal mammogram on 04/06/2020.   Review of Systems:  Pertinent items noted in HPI and remainder of comprehensive ROS otherwise negative.  Physical Exam:   General:  Alert, oriented and cooperative. Patient appears to be in no acute distress.  Mental Status: Normal mood and affect. Normal behavior. Normal judgment and thought content.   Respiratory: Normal respiratory effort, no problems with respiration noted  Rest of physical exam deferred due to type of encounter  Labs and Imaging No results found for this or any previous visit (from the past 336 hour(s)). US PELVIC COMPLETE WITH TRANSVAGINAL  Result Date: 09/19/2020 CLINICAL DATA:  Initial evaluation for menorrhagia with regular cycle. EXAM: TRANSABDOMINAL AND TRANSVAGINAL ULTRASOUND OF PELVIS TECHNIQUE: Both transabdominal and transvaginal ultrasound examinations of the pelvis were performed. Transabdominal technique was performed for global imaging of the pelvis including uterus, ovaries, adnexal regions, and pelvic cul-de-sac. It was necessary to proceed with endovaginal exam following the transabdominal exam to visualize the uterus, endometrium, and ovaries. COMPARISON:  Prior ultrasound 10/21/2009 FINDINGS: Uterus Measurements: 8.8 x 4.9 x 5.7 cm = volume: 123.4 mL. No fibroids or other mass visualized. Endometrium Thickness: 8.1 mm. Endometrial complex heterogeneous without definite focal lesion. No associated vascularity. Right ovary Measurements: 3.2 x 1.7 x 2.0 cm = volume: 5.8 mL. Normal appearance/no adnexal mass. Left ovary Measurements: 3.1 x 1.5 x 1.7 cm = volume: 4.2 mL. Normal appearance/no adnexal mass. Other findings No abnormal free fluid. IMPRESSION: 1. Endometrial stripe mildly heterogeneous  measuring 8.1 mm in thickness. If bleeding remains unresponsive to hormonal or medical therapy, sonohysterogram should be considered for focal lesion work-up. (Ref:  Radiological Reasoning: Algorithmic Workup of Abnormal Vaginal Bleeding with Endovaginal Sonography and Sonohysterography. AJR 2008; [id number]:I50-27). 2. Otherwise unremarkable and normal pelvic ultrasound. No other acute abnormality. Electronically Signed   By: Rise Mu M.D.   On: 09/19/2020 23:54       Assessment and Plan:     1. Menorrhagia with regular cycle Hopefully ablation will fix this  2. Postop check Doing well.       I discussed the assessment and treatment plan with the patient. The patient was provided an opportunity to ask questions and all were answered. The patient agreed with the plan and demonstrated an understanding of the instructions.   The patient was advised to call back or seek an in-person evaluation/go to the ED if the symptoms worsen or if the condition fails to improve as anticipated.  I provided 11 minutes of face-to-face time during this encounter.   Reva Bores, MD Center for [employer], [facility]

## 2020-11-16 ENCOUNTER — Encounter: Payer: Self-pay | Admitting: Endocrinology

## 2020-11-16 ENCOUNTER — Ambulatory Visit (INDEPENDENT_AMBULATORY_CARE_PROVIDER_SITE_OTHER): Payer: Commercial Managed Care - PPO | Admitting: Endocrinology

## 2020-11-16 ENCOUNTER — Other Ambulatory Visit: Payer: Self-pay

## 2020-11-16 VITALS — BP 126/88 | HR 62 | Ht 66.0 in | Wt 203.0 lb

## 2020-11-16 DIAGNOSIS — E038 Other specified hypothyroidism: Secondary | ICD-10-CM

## 2020-11-16 LAB — T4, FREE: Free T4: 0.79 ng/dL (ref 0.60–1.60)

## 2020-11-16 LAB — T3, FREE: T3, Free: 3.2 pg/mL (ref 2.3–4.2)

## 2020-11-16 LAB — TSH: TSH: 1.48 u[IU]/mL (ref 0.35–4.50)

## 2020-11-16 NOTE — Patient Instructions (Addendum)
Let's check the ultrasound.  you will receive a phone call, about a day and time for an appointment.   Blood tests are requested for you today.  We'll let you know about the results.

## 2020-11-16 NOTE — Progress Notes (Signed)
Subjective:    Patient ID: Sherri [name], female    DOB: 14-Aug-1976, 45 y.o.   MRN: [id number]  HPI Pt is referred by Evangeline Dakin, PA, for hypothyroidism.  Pt reports hypothyroidism was dx'ed in approx 2018.  she has been off and on prescribed thyroid hormone therapy since then.  She has been on it consistently since 2020, until she stopped 1 month ago.  she has never taken kelp or any other type of non-prescribed thyroid product.  she has never had thyroid imaging.  She has had TL.  she has never had thyroid surgery, or XRT to the neck.  she has never been on amiodarone or lithium.   She reports weight gain, fatigue, dry skin, cold intolerance, and hair loss.  She request to check [place].   Past Medical History:  Diagnosis Date  . Abnormal uterine bleeding (AUB)   . Chronic back pain    lower back from fall summer 2021  . Complication of anesthesia    slow to awaken in past  . Hashimoto's thyroiditis 2020  . Hypothyroidism   . PONV (postoperative nausea and vomiting)    takes iv nausea med    Past Surgical History:  Procedure Laterality Date  . CESAREAN SECTION  2003   x 1  . DILITATION & CURRETTAGE/HYSTROSCOPY WITH HYDROTHERMAL ABLATION N/A 09/28/2020   Procedure: DILATATION & CURETTAGE/HYSTEROSCOPY WITH HYDROTHERMAL ABLATION;  Surgeon: Reva Bores, MD;  Location: [facility];  Service: Gynecology;  Laterality: N/A;  . TONSILLECTOMY  1990's  . TUBAL LIGATION  2003    Social History   Socioeconomic History  . Marital status: Married    Spouse name: Not on file  . Number of children: Not on file  . Years of education: Not on file  . Highest education level: Not on file  Occupational History  . Not on file  Tobacco Use  . Smoking status: Never Smoker  . Smokeless tobacco: Never Used  Vaping Use  . Vaping Use: Never used  Substance and Sexual Activity  . Alcohol use: No  . Drug use: No  . Sexual activity: Not on file  Other Topics Concern  . Not on  file  Social History Narrative  . Not on file   Social Determinants of Health   Financial Resource Strain: Not on file  Food Insecurity: Not on file  Transportation Needs: Not on file  Physical Activity: Not on file  Stress: Not on file  Social Connections: Not on file  Intimate Partner Violence: Not on file    Current Outpatient Medications on File Prior to Visit  Medication Sig Dispense Refill  . Multiple Vitamins-Minerals (MULTIVITAMIN WITH MINERALS) tablet Take 1 tablet by mouth daily.    Marland Kitchen OVER THE COUNTER MEDICATION Biotin 1 daily    . OVER THE COUNTER MEDICATION Zinc 1 daily    . OVER THE COUNTER MEDICATION Iron 1 daily    . OVER THE COUNTER MEDICATION Vitamin d 1 daily    . levothyroxine (SYNTHROID) 25 MCG tablet Take 25 mcg by mouth every morning. (Patient not taking: Reported on 11/16/2020)    . oxyCODONE (OXY IR/ROXICODONE) 5 MG immediate release tablet Take 1 tablet (5 mg total) by mouth every 6 (six) hours as needed for severe pain. (Patient not taking: Reported on 11/16/2020) 8 tablet 0  . [DISCONTINUED] albuterol (PROVENTIL HFA;VENTOLIN HFA) 108 (90 Base) MCG/ACT inhaler Inhale 2 puffs into the lungs every 4 (four) hours as needed for wheezing  or shortness of breath. 1 Inhaler 2   No current facility-administered medications on file prior to visit.    Allergies  Allergen Reactions  . Codeine     Nauesa, vomit, hives  . Erythromycin Hives  . Ivp Dye [Iodinated Diagnostic Agents]     Feel like throat closing, itching nausea  . Morphine And Related Other (See Comments)    Nausea vomit hives    Family History  Problem Relation Age of Onset  . Healthy Mother   . Bladder Cancer Father   . Lung cancer Father   . Mitral valve prolapse Father   . Thyroid disease Neg Hx     BP 126/88   Pulse 62   Ht 5\' 6"  (1.676 m)   Wt 203 lb (92.1 kg)   SpO2 98%   BMI 32.77 kg/m   Review of Systems denies depression and numbness.      Objective:   Physical Exam VS: see  vs page GEN: no distress HEAD: head: no deformity eyes: no periorbital swelling, no proptosis external nose and ears are normal NECK: supple, thyroid is not enlarged CHEST WALL: no deformity LUNGS: clear to auscultation CV: reg rate and rhythm, no murmur.  MUSCULOSKELETAL: gait is normal and steady EXTEMITIES: no deformity.  no leg edema NEURO:  readily moves all 4's.  sensation is intact to touch on all 4's SKIN:  Normal texture and temperature.  No rash or suspicious lesion is visible.   NODES:  None palpable at the neck PSYCH: alert, well-oriented.  Does not appear anxious nor depressed.    Lab Results  Component Value Date   TSH 1.760 09/19/2020   I have reviewed outside records, and summarized: Pt was noted to have hypothyroidism, and referred here.  Main symptom reported was weight gain.    Lab Results  Component Value Date   TSH 1.48 11/16/2020      Assessment & Plan:  Hypothyroidism: well-replaced.  Please continue the same synthroid.   Dry skin, and other sxs: not thyroid-related.    Patient Instructions  Let's check the ultrasound.  you will receive a phone call, about a day and time for an appointment.   Blood tests are requested for you today.  We'll let you know about the results.

## 2020-11-17 LAB — THYROID PEROXIDASE ANTIBODY: Thyroperoxidase Ab SerPl-aCnc: 2 IU/mL (ref <9)

## 2020-12-05 ENCOUNTER — Ambulatory Visit
Admission: RE | Admit: 2020-12-05 | Discharge: 2020-12-05 | Disposition: A | Discharge status: Home / Self Care | Payer: Commercial Managed Care - PPO | Source: Ambulatory Visit | Attending: Endocrinology | Admitting: Endocrinology

## 2020-12-05 DIAGNOSIS — E038 Other specified hypothyroidism: Secondary | ICD-10-CM

## 2020-12-27 ENCOUNTER — Telehealth: Payer: Commercial Managed Care - PPO | Admitting: Physician Assistant

## 2020-12-27 DIAGNOSIS — J019 Acute sinusitis, unspecified: Secondary | ICD-10-CM

## 2020-12-27 MED ORDER — AMOXICILLIN-POT CLAVULANATE 875-125 MG PO TABS: 1.0000 | ORAL_TABLET | Freq: Two times a day (BID) | ORAL | 0 refills | Status: DC

## 2020-12-27 NOTE — Progress Notes (Signed)

## 2021-01-28 IMAGING — MG DIGITAL DIAGNOSTIC BILAT W/ TOMO W/ CAD
8 of 14 series · 8 of 40 positions shown · non-contrast
Comparison: Previous exam(s).

CLINICAL DATA: Follow-up left breast asymmetry from 8844

EXAM:
DIGITAL DIAGNOSTIC BILATERAL MAMMOGRAM
ULTRASOUND RIGHT BREAST

[L MLO synth-2D (1 of 2)]
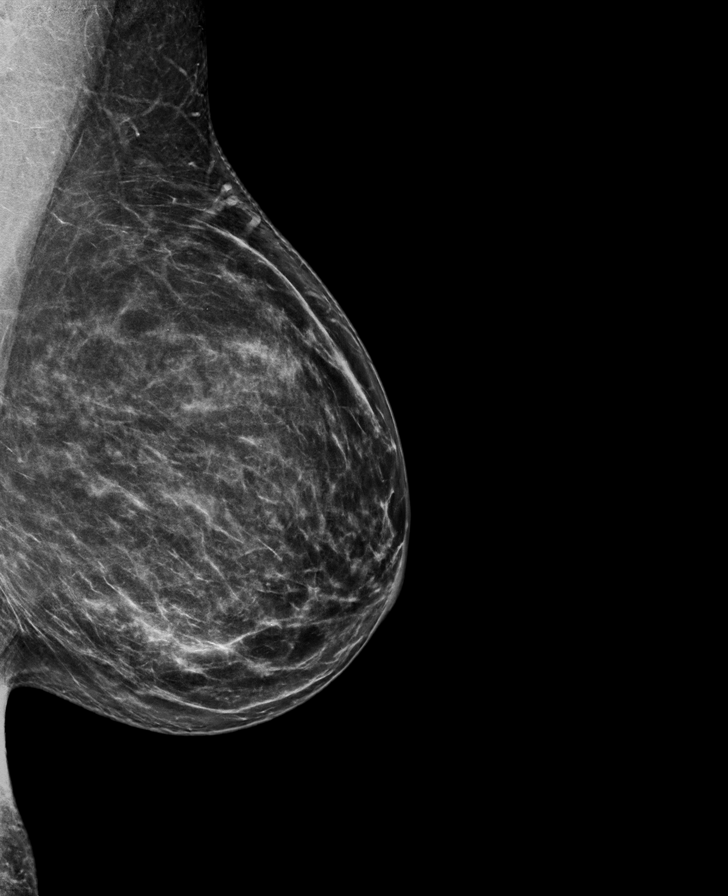

[L MLO synth-2D (2 of 2)]
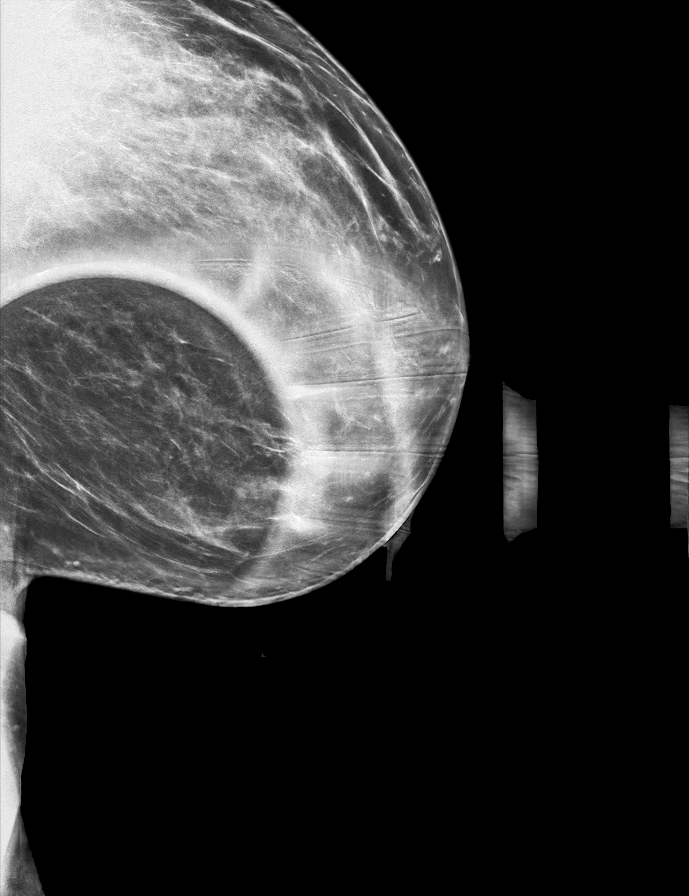

[R CC synth-2D (1 of 3)]
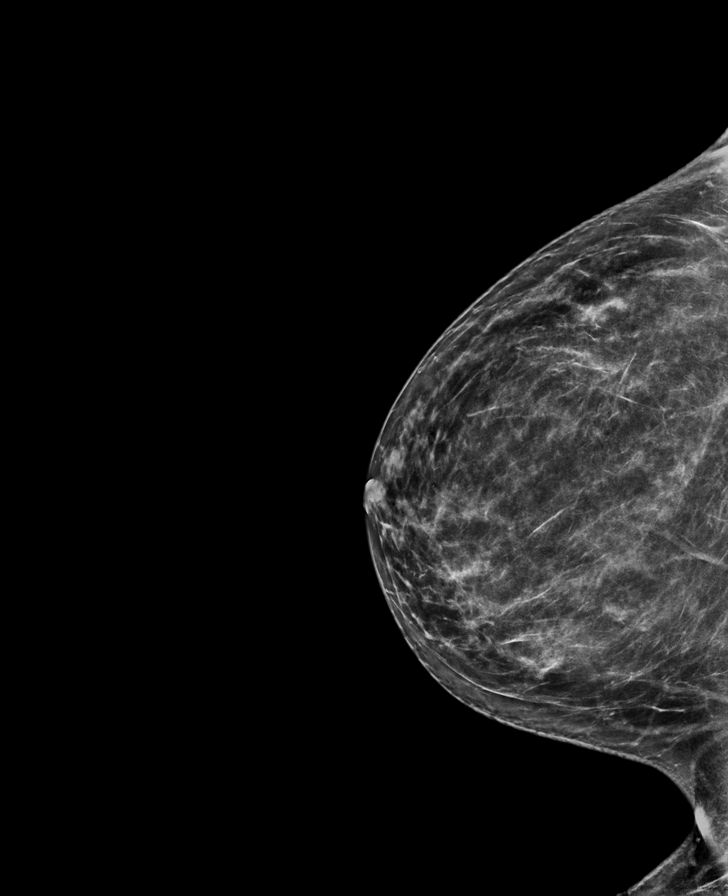

[L CC synth-2D (1 of 2)]
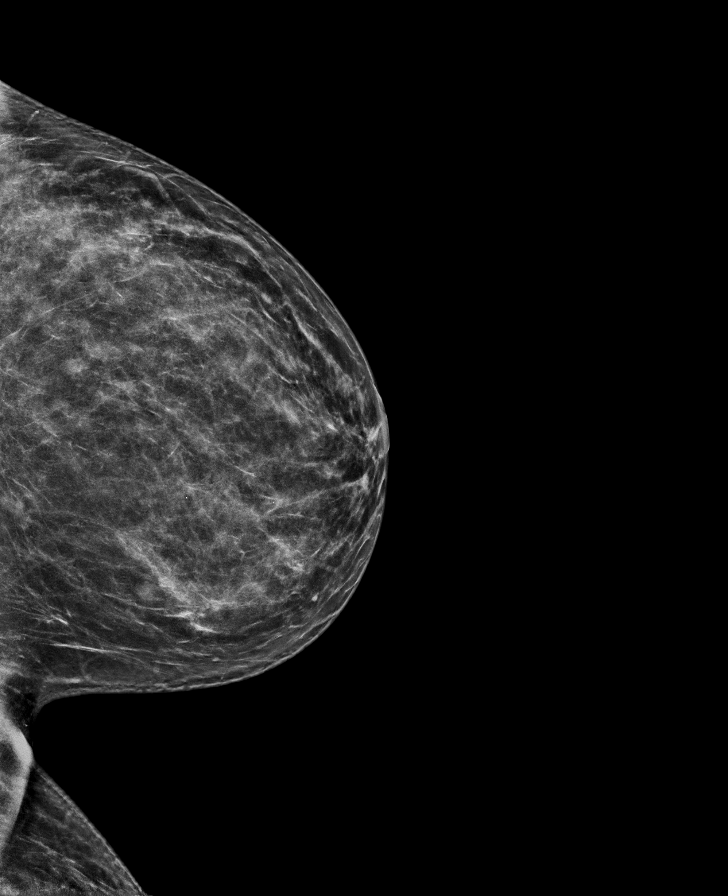

[R CC synth-2D (2 of 3)]
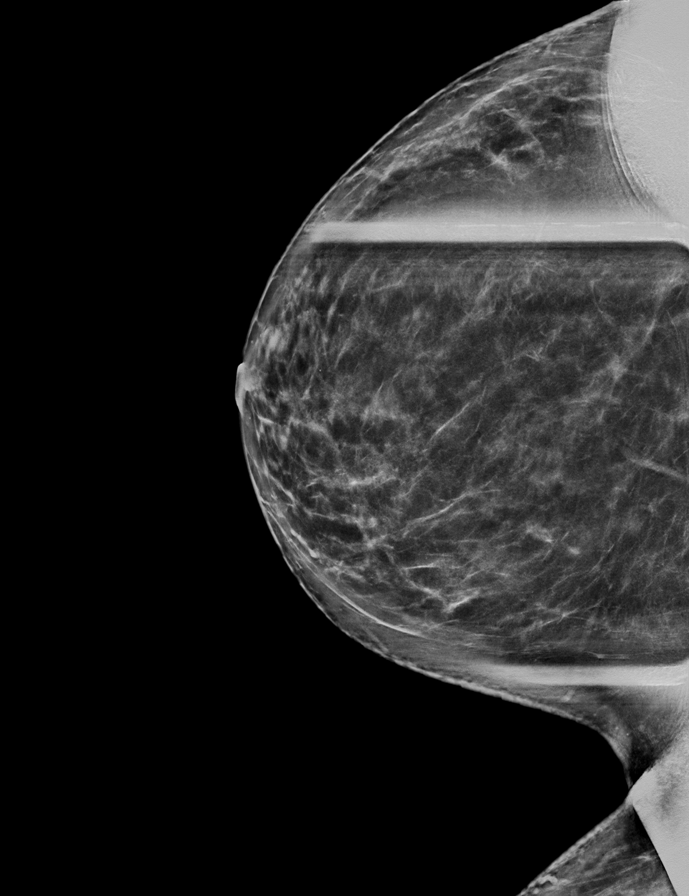

[L CC synth-2D (2 of 2)]
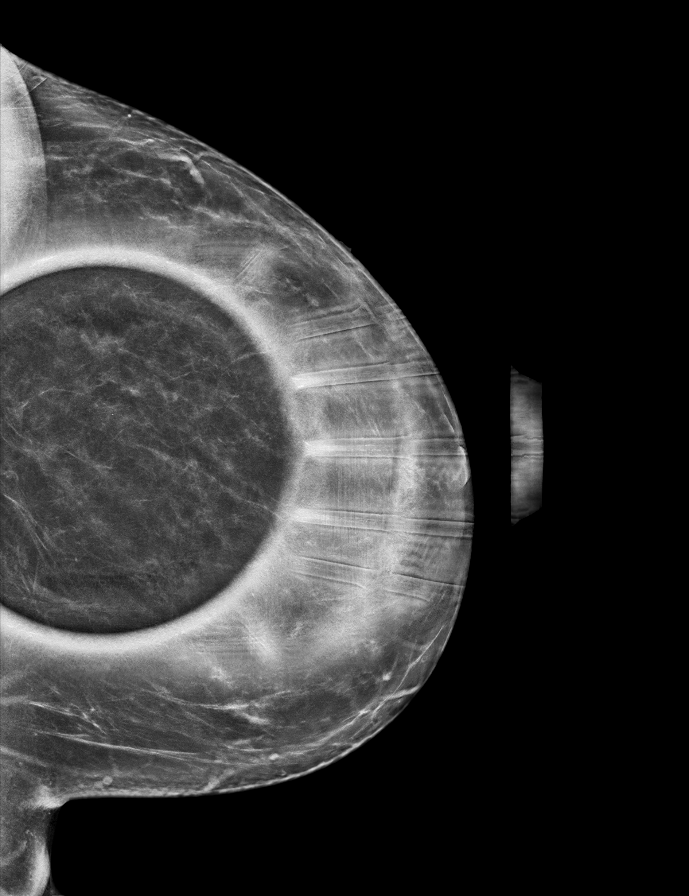

[R CC synth-2D (3 of 3)]
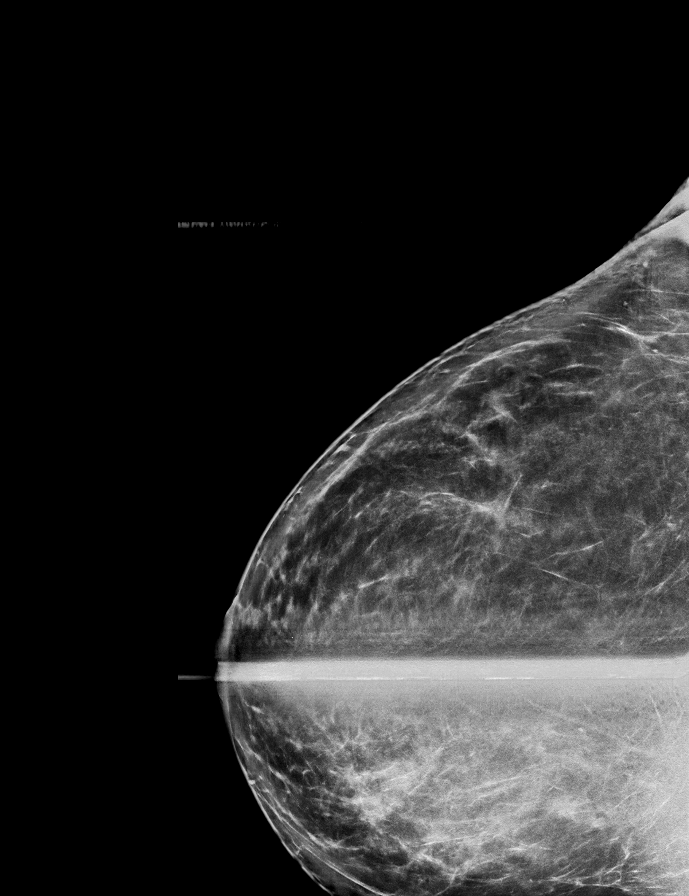

[L MLO tomo · tomo slice 41/81.0]
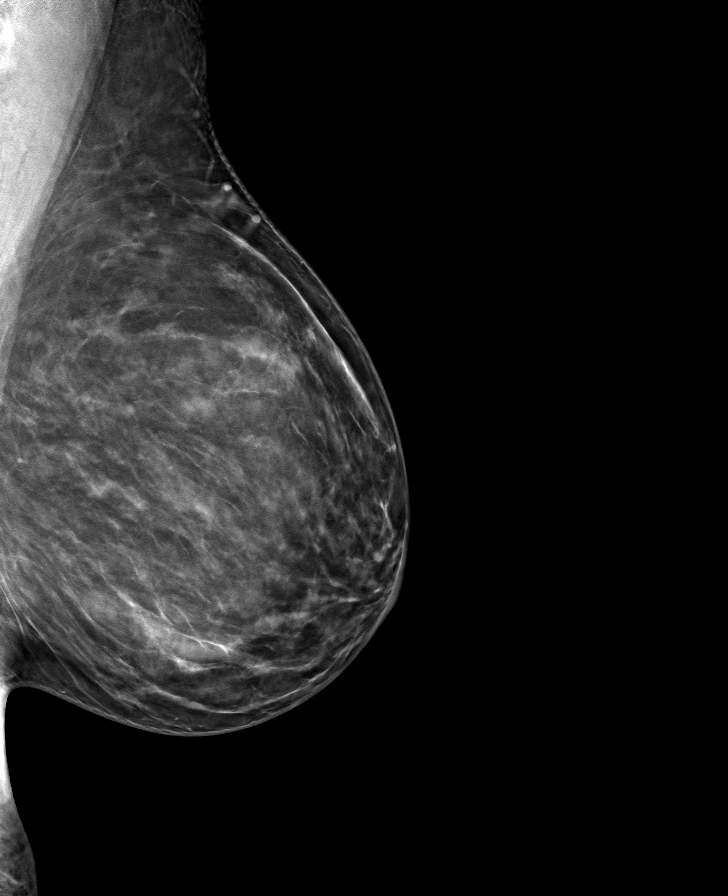

[8 of 40 positions shown; findings below may reference images not displayed]

ACR Breast Density Category c: The breast tissue is heterogeneously
dense, which may obscure small masses.
FINDINGS: The asymmetry in the medial left breast is stable. Multiple
asymmetries in both breasts improves on additional imaging. The
patient's breasts are much denser in the interval accounting for the
asymmetries. The patient states she has lost significant weight
which explains the findings. No evidence of malignancy in either
breast.

On physical exam, no suspicious lumps are identified.

Targeted ultrasound is performed, showing no abnormalities in the
medial right breast.
IMPRESSION: No mammographic or sonographic evidence of malignancy. Benign stable
asymmetry in the medial left breast.

RECOMMENDATION:
Annual screening mammography.

I have discussed the findings and recommendations with the patient.
If applicable, a reminder letter will be sent to the patient
regarding the next appointment.

BI-RADS CATEGORY  2: Benign.

## 2021-02-07 IMAGING — CR DG FOOT COMPLETE 3+V*R*
3 series · 3 of 3 positions shown · non-contrast
Comparison: None.

CLINICAL DATA: Pain and swelling, first toe

EXAM:
RIGHT FOOT COMPLETE - 3+ VIEW

[foot ap]
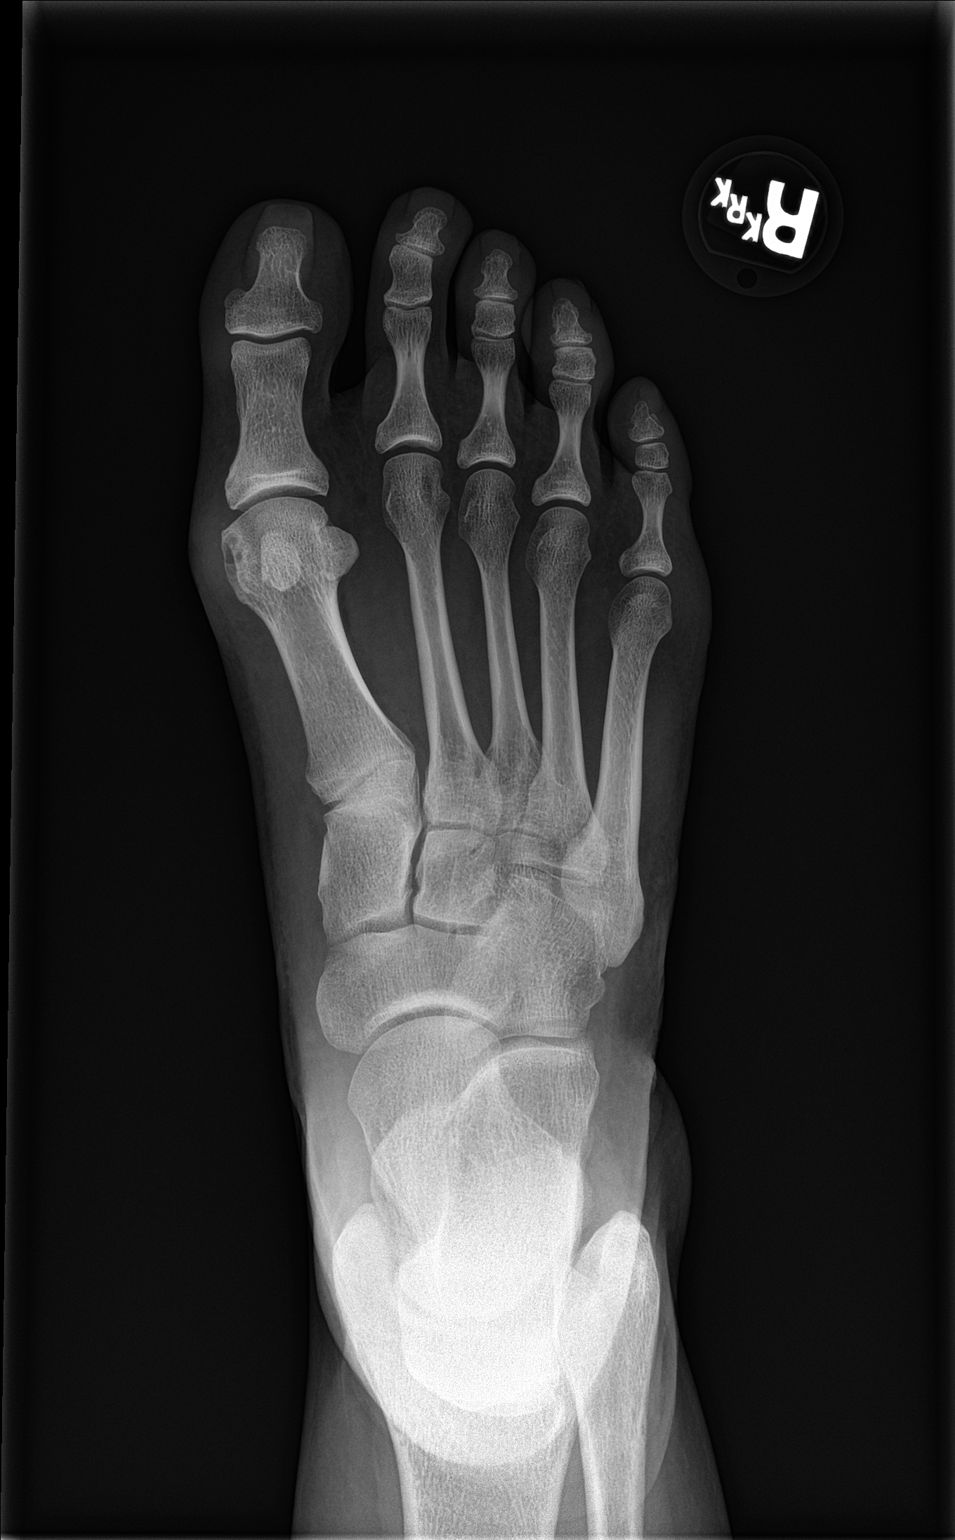

[foot obl]
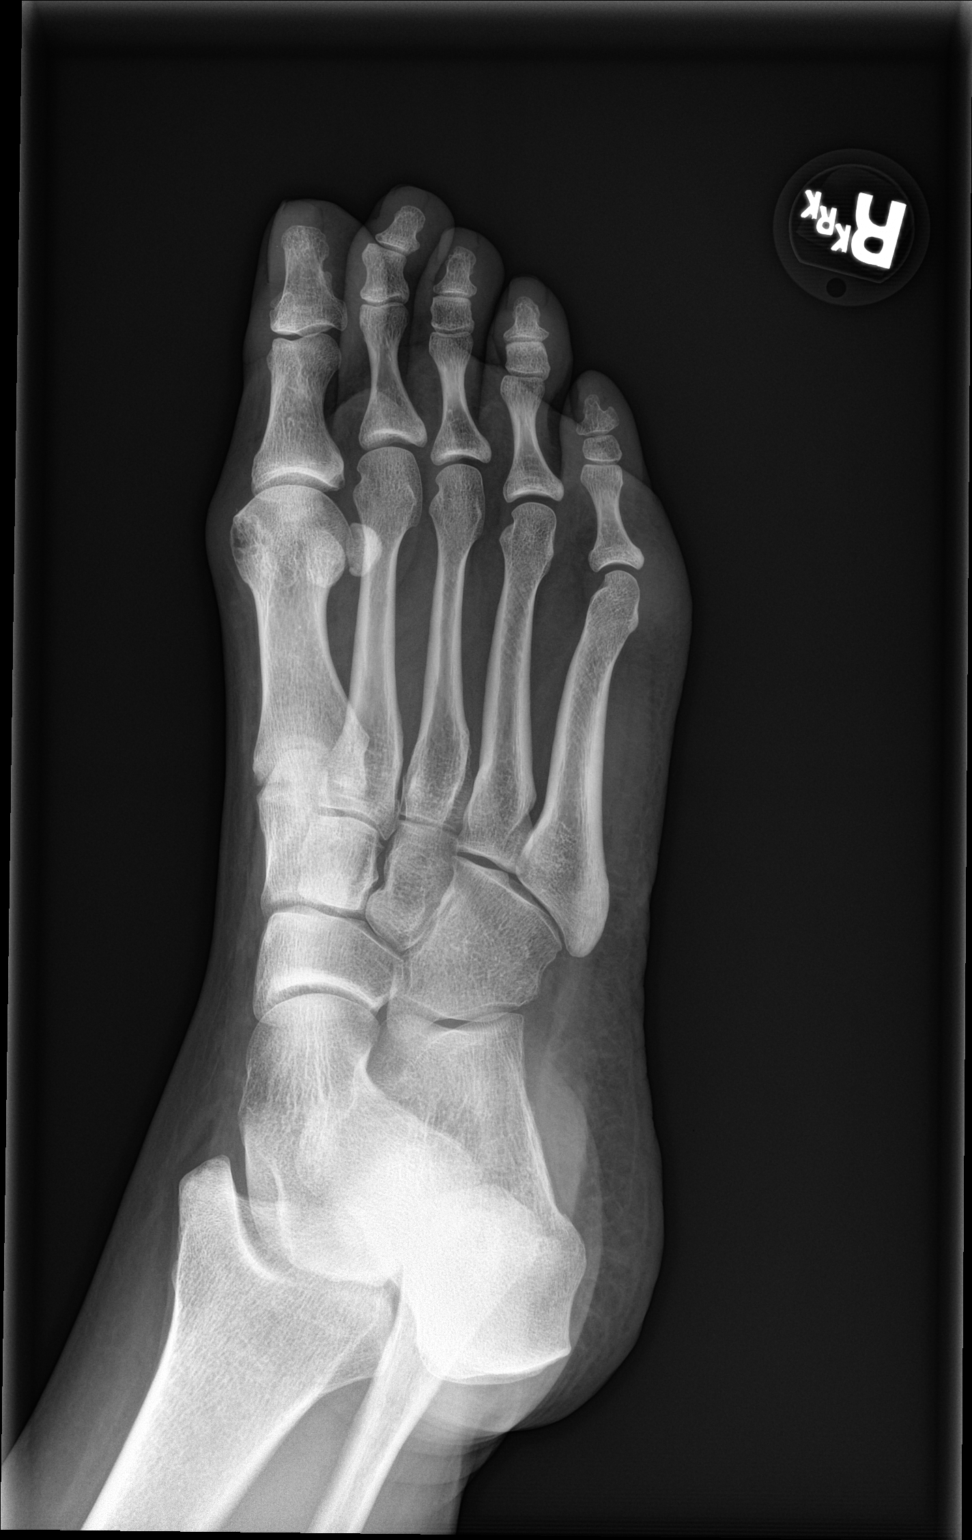

[foot lat]
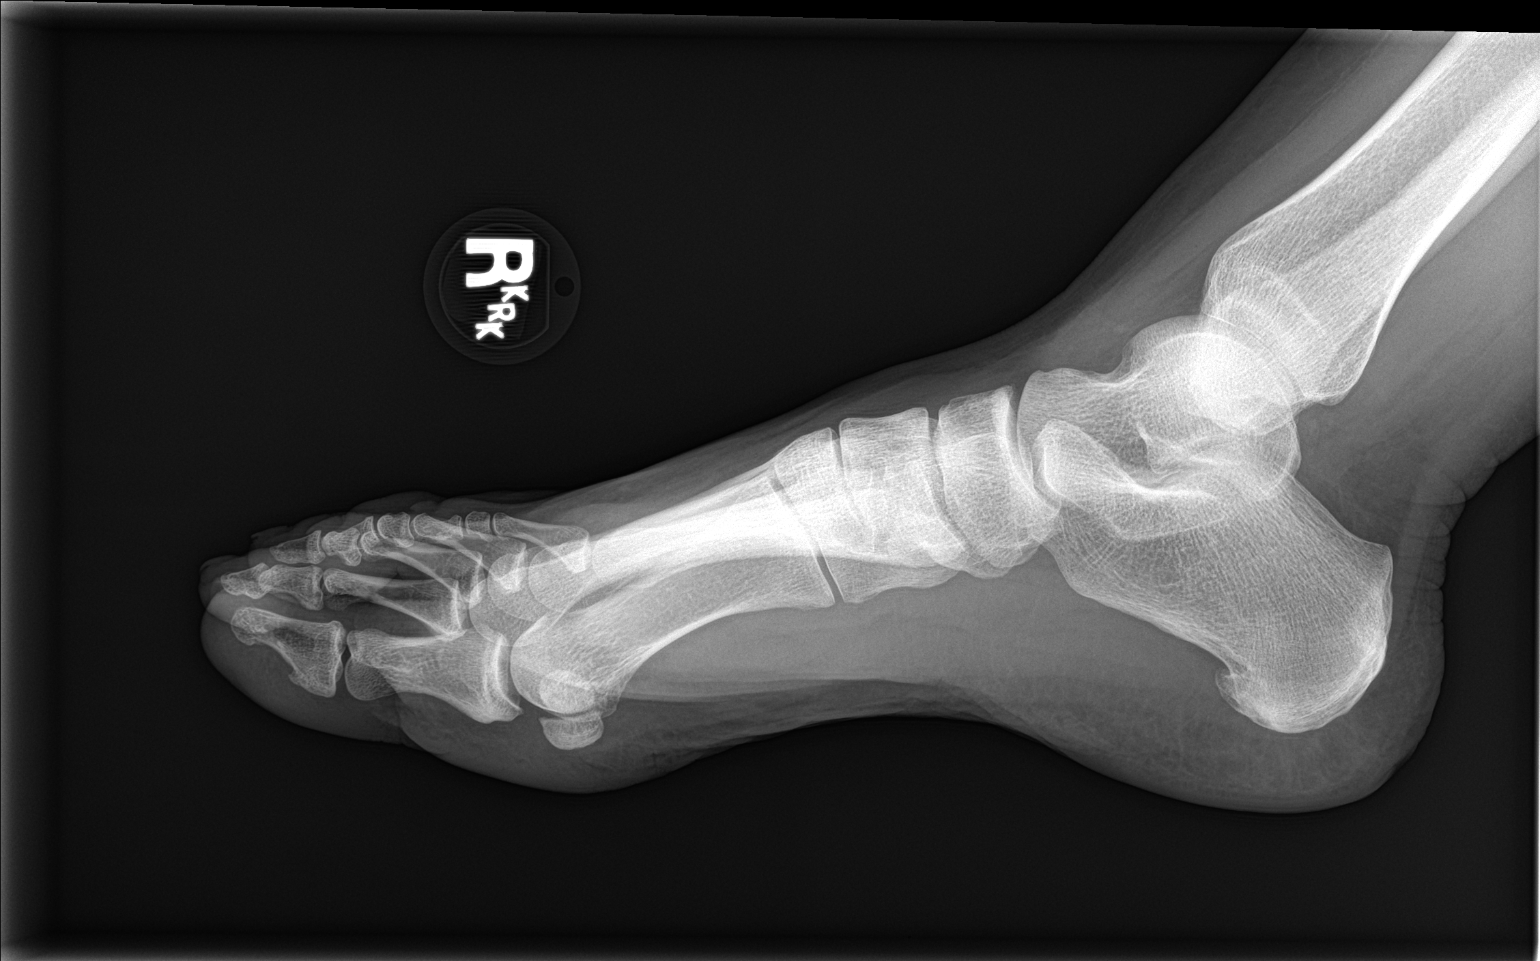

[3 of 3 positions shown; findings below may reference images not displayed]

FINDINGS: There is no evidence of fracture or dislocation. Subchondral cyst in
the medial aspect of the first metatarsal head with overlying soft
tissue swelling. Small calcaneal spur at the plantar aponeurosis.
There is no other evidence of arthropathy or other focal bone
abnormality. Soft tissues are unremarkable.
IMPRESSION: 1. Negative for fracture or other acute bone abnormality.
2. Degenerative changes in the first metatarsal head with overlying
soft tissue swelling.

## 2021-02-17 ENCOUNTER — Ambulatory Visit: Payer: Commercial Managed Care - PPO | Admitting: Endocrinology

## 2021-03-02 ENCOUNTER — Telehealth: Payer: Self-pay | Admitting: *Deleted

## 2021-03-02 ENCOUNTER — Other Ambulatory Visit: Payer: Self-pay

## 2021-03-02 ENCOUNTER — Ambulatory Visit (INDEPENDENT_AMBULATORY_CARE_PROVIDER_SITE_OTHER): Payer: Commercial Managed Care - PPO | Admitting: *Deleted

## 2021-03-02 ENCOUNTER — Other Ambulatory Visit (HOSPITAL_COMMUNITY)
Admission: RE | Admit: 2021-03-02 | Discharge: 2021-03-02 | Disposition: A | Discharge status: Home / Self Care | Payer: Commercial Managed Care - PPO | Source: Ambulatory Visit | Attending: Obstetrics and Gynecology | Admitting: Obstetrics and Gynecology

## 2021-03-02 VITALS — BP 154/85 | HR 78

## 2021-03-02 DIAGNOSIS — B373 Candidiasis of vulva and vagina: Secondary | ICD-10-CM | POA: Insufficient documentation

## 2021-03-02 DIAGNOSIS — B9689 Other specified bacterial agents as the cause of diseases classified elsewhere: Secondary | ICD-10-CM | POA: Diagnosis not present

## 2021-03-02 DIAGNOSIS — N898 Other specified noninflammatory disorders of vagina: Secondary | ICD-10-CM | POA: Insufficient documentation

## 2021-03-02 DIAGNOSIS — N760 Acute vaginitis: Secondary | ICD-10-CM | POA: Insufficient documentation

## 2021-03-02 NOTE — Progress Notes (Signed)
Patient seen and assessed by nursing staff.  Agree with documentation and plan.  

## 2021-03-02 NOTE — Progress Notes (Signed)
SUBJECTIVE:  45 y.o. female complains of vaginal dryness and irritation for 1-2 days.  Denies abnormal vaginal bleeding or significant pelvic pain or fever. No UTI symptoms. Denies history of known exposure to STD.  No LMP recorded.  OBJECTIVE:  She appears well, afebrile. Urine dipstick: not done.  ASSESSMENT:  Vaginal Irritation   PLAN:  BVAG, CVAG probe sent to lab. Treatment: To be determined once lab results are received ROV prn if symptoms persist or worsen.

## 2021-03-02 NOTE — Telephone Encounter (Signed)
Pt called stating she is still having her cycles since having her ablation. But over the past 2 months, prior to her starting her period she is having vaginal irritation, itching and dryness and the resolves after her cycle. Pt had tried OTC creams which have not been helpful. Pt was wondering if it related to hormones and wanted to reach out to Dr Shawnie Pons. Informed I will reach out, but she may need an appointment and will have pt come in today for a self swab since she is currently having the irritation.  Pt verbalizes and understands

## 2021-03-03 LAB — CERVICOVAGINAL ANCILLARY ONLY
Bacterial Vaginitis (gardnerella): POSITIVE — AB
Candida Glabrata: NEGATIVE
Candida Vaginitis: POSITIVE — AB
Comment: NEGATIVE
Comment: NEGATIVE
Comment: NEGATIVE

## 2021-03-06 ENCOUNTER — Other Ambulatory Visit: Payer: Self-pay | Admitting: Family Medicine

## 2021-03-06 DIAGNOSIS — G5621 Lesion of ulnar nerve, right upper limb: Secondary | ICD-10-CM | POA: Insufficient documentation

## 2021-03-06 MED ORDER — FLUCONAZOLE 150 MG PO TABS: 150.0000 mg | ORAL_TABLET | Freq: Once | ORAL | 0 refills | Status: AC

## 2021-03-06 MED ORDER — METRONIDAZOLE 500 MG PO TABS: 500.0000 mg | ORAL_TABLET | Freq: Two times a day (BID) | ORAL | 0 refills | Status: DC

## 2021-03-27 ENCOUNTER — Other Ambulatory Visit: Payer: Self-pay | Admitting: *Deleted

## 2021-03-27 MED ORDER — METRONIDAZOLE 500 MG PO TABS
500.0000 mg | ORAL_TABLET | Freq: Two times a day (BID) | ORAL | 0 refills | Status: DC
Start: 2021-03-27 — End: 2021-05-25

## 2021-04-06 ENCOUNTER — Ambulatory Visit (INDEPENDENT_AMBULATORY_CARE_PROVIDER_SITE_OTHER): Payer: Commercial Managed Care - PPO | Admitting: Family Medicine

## 2021-04-06 ENCOUNTER — Other Ambulatory Visit: Payer: Self-pay

## 2021-04-06 ENCOUNTER — Encounter: Payer: Self-pay | Admitting: Family Medicine

## 2021-04-06 VITALS — BP 136/88 | HR 69 | Wt 210.0 lb

## 2021-04-06 DIAGNOSIS — N920 Excessive and frequent menstruation with regular cycle: Secondary | ICD-10-CM | POA: Diagnosis not present

## 2021-04-06 DIAGNOSIS — R232 Flushing: Secondary | ICD-10-CM | POA: Diagnosis not present

## 2021-04-06 DIAGNOSIS — E038 Other specified hypothyroidism: Secondary | ICD-10-CM

## 2021-04-06 DIAGNOSIS — R635 Abnormal weight gain: Secondary | ICD-10-CM | POA: Diagnosis not present

## 2021-04-06 MED ORDER — PHENTERMINE HCL 37.5 MG PO CAPS: 37.5000 mg | ORAL_CAPSULE | ORAL | 1 refills | Status: DC

## 2021-04-06 NOTE — Progress Notes (Signed)
   Subjective:    Patient ID: Sherri [name] is a 45 y.o. female presenting with Vaginal Discharge  on 04/06/2021  HPI: Has had BV right before her cycles. She is having recurrences. She has had an ablation and her cycles are about the same. Has bled through her clothes several times. Having hot flashes. Having night sweats.   Review of Systems  Constitutional: Negative for chills and fever.  Respiratory: Negative for shortness of breath.   Cardiovascular: Negative for chest pain.  Gastrointestinal: Negative for abdominal pain, nausea and vomiting.  Genitourinary: Negative for dysuria.  Skin: Negative for rash.      Objective:    BP 136/88   Pulse 69   Wt 210 lb (95.3 kg)   BMI 33.89 kg/m  Physical Exam Constitutional:      General: She is not in acute distress.    Appearance: She is well-developed.  HENT:     Head: Normocephalic and atraumatic.  Eyes:     General: No scleral icterus. Cardiovascular:     Rate and Rhythm: Normal rate.  Pulmonary:     Effort: Pulmonary effort is normal.  Abdominal:     Palpations: Abdomen is soft.  Musculoskeletal:     Cervical back: Neck supple.  Skin:    General: Skin is warm and dry.  Neurological:     Mental Status: She is alert and oriented to person, place, and time.         Assessment & Plan:   Problem List Items Addressed This Visit      Unprioritized   Menorrhagia    Discussed possible need for IUD or hyst. She has failed IUD in the past. She will consider.      Hypothyroid    Will stop her biotin and re-check levels      Relevant Orders   TSH   T3, free   T4, free    Other Visit Diagnoses    Hot flashes    -  Primary   ? peri-menopausal which might explain multiple symptoms   Relevant Orders   Follicle stimulating hormone   Weight gain       trial of phentermine--risks reviewed   Relevant Medications   phentermine 37.5 MG capsule      Return in about 6 weeks (around 05/18/2021) for a  follow-up.  Reva Bores 04/06/2021 4:57 PM

## 2021-04-06 NOTE — Assessment & Plan Note (Signed)
Will stop her biotin and re-check levels

## 2021-04-06 NOTE — Progress Notes (Signed)
Bad discharge prior to starting cycles each month

## 2021-04-06 NOTE — Assessment & Plan Note (Signed)
Discussed possible need for IUD or hyst. She has failed IUD in the past. She will consider.

## 2021-04-25 ENCOUNTER — Ambulatory Visit
Admission: EM | Admit: 2021-04-25 | Discharge: 2021-04-25 | Disposition: A | Discharge status: Home / Self Care | Payer: Commercial Managed Care - PPO | Attending: Sports Medicine | Admitting: Sports Medicine

## 2021-04-25 ENCOUNTER — Other Ambulatory Visit: Payer: Self-pay

## 2021-04-25 DIAGNOSIS — L299 Pruritus, unspecified: Secondary | ICD-10-CM

## 2021-04-25 DIAGNOSIS — R21 Rash and other nonspecific skin eruption: Secondary | ICD-10-CM

## 2021-04-25 DIAGNOSIS — L239 Allergic contact dermatitis, unspecified cause: Secondary | ICD-10-CM

## 2021-04-25 MED ORDER — METHYLPREDNISOLONE SODIUM SUCC 125 MG IJ SOLR
125.0000 mg | Freq: Once | INTRAMUSCULAR | Status: AC
  Administered 2021-04-25: 125 mg via INTRAMUSCULAR

## 2021-04-25 MED ORDER — HYDROXYZINE HCL 25 MG PO TABS: 25.0000 mg | ORAL_TABLET | Freq: Four times a day (QID) | ORAL | 0 refills | Status: DC

## 2021-04-25 MED ORDER — PREDNISONE 10 MG (21) PO TBPK: ORAL_TABLET | Freq: Every day | ORAL | 0 refills | Status: DC

## 2021-04-25 NOTE — ED Provider Notes (Signed)
[facility]    CSN: [id number] Arrival date & time: 04/25/21  1730      History   Chief Complaint Chief Complaint  Patient presents with   Rash    HPI Sherri [name] is a 45 y.o. female.   45 year old female who presents for evaluation of a rash that began 3 days ago on her right arm and is now spreading onto her left arm.  She said that she thinks it occurred when she was eating lunch outside.  That said, she is not sure what the exact exposure is.  It is very itchy and she has been using calamine lotion as well as Benadryl and hydrocortisone cream.  Seems to be spreading and getting even more itchy and comes in today for initial urgent care evaluation.  Normally is seen by [facility] physicians in [place].  She works for [employer].  She denies any symptoms of angioedema, anaphylaxis, throat tightening, difficulty breathing, or chest pain.  No red flag signs or symptoms elicited on history.   Past Medical History:  Diagnosis Date   Abnormal uterine bleeding (AUB)    Chronic back pain    lower back from fall summer 2021   Complication of anesthesia    slow to awaken in past   Hashimoto's thyroiditis 2020   Hypothyroidism    PONV (postoperative nausea and vomiting)    takes iv nausea med    Patient Active Problem List   Diagnosis Date Noted   Hypothyroid 09/16/2020   Menorrhagia 03/22/2020    Past Surgical History:  Procedure Laterality Date   CESAREAN SECTION  2003   x 1   DILITATION & CURRETTAGE/HYSTROSCOPY WITH HYDROTHERMAL ABLATION N/A 09/28/2020   Procedure: DILATATION & CURETTAGE/HYSTEROSCOPY WITH HYDROTHERMAL ABLATION;  Surgeon: Reva Bores, MD;  Location: [facility] [place];  Service: Gynecology;  Laterality: N/A;   TONSILLECTOMY  1990's   TUBAL LIGATION  2003    OB History   No obstetric history on file.      Home Medications    Prior to Admission medications   Medication Sig Start Date End Date Taking? Authorizing Provider   hydrOXYzine (ATARAX/VISTARIL) 25 MG tablet Take 1 tablet (25 mg total) by mouth every 6 (six) hours. 04/25/21  Yes Delton See, MD  Multiple Vitamins-Minerals (MULTIVITAMIN WITH MINERALS) tablet Take 1 tablet by mouth daily.   Yes [provider]  OVER THE COUNTER MEDICATION Biotin 1 daily   Yes [provider]  OVER THE COUNTER MEDICATION Zinc 1 daily   Yes [provider]  OVER THE COUNTER MEDICATION Iron 1 daily   Yes [provider]  OVER THE COUNTER MEDICATION Vitamin d 1 daily   Yes [provider]  predniSONE (STERAPRED UNI-PAK 21 TAB) 10 MG (21) TBPK tablet Take by mouth daily. Take 6 tabs by mouth daily  for 2 days, then 5 tabs for 2 days, then 4 tabs for 2 days, then 3 tabs for 2 days, 2 tabs for 2 days, then 1 tab by mouth daily for 2 days. Start on 04/26/21 04/25/21  Yes Delton See, MD  amoxicillin-clavulanate (AUGMENTIN) 875-125 MG tablet Take 1 tablet by mouth 2 (two) times daily. One po bid x 7 days 12/27/20   Muthersbaugh, Dahlia Client, PA-C  levothyroxine (SYNTHROID) 25 MCG tablet Take 25 mcg by mouth every morning. 03/16/20   [provider]  metroNIDAZOLE (FLAGYL) 500 MG tablet Take 1 tablet (500 mg total) by mouth 2 (two) times daily. 03/27/21  Reva Bores, MD  oxyCODONE (OXY IR/ROXICODONE) 5 MG immediate release tablet Take 1 tablet (5 mg total) by mouth every 6 (six) hours as needed for severe pain. Patient not taking: No sig reported 09/28/20   Reva Bores, MD  phentermine 37.5 MG capsule Take 1 capsule (37.5 mg total) by mouth every morning. 04/06/21   Reva Bores, MD  albuterol (PROVENTIL HFA;VENTOLIN HFA) 108 (90 Base) MCG/ACT inhaler Inhale 2 puffs into the lungs every 4 (four) hours as needed for wheezing or shortness of breath. 10/03/18 11/14/19  Withrow, Everardo All, FNP    Family History Family History  Problem Relation Age of Onset   Healthy Mother    Bladder Cancer Father    Lung cancer Father    Mitral valve  prolapse Father    Thyroid disease Neg Hx     Social History Social History   Tobacco Use   Smoking status: Never   Smokeless tobacco: Never  Vaping Use   Vaping Use: Never used  Substance Use Topics   Alcohol use: No   Drug use: No     Allergies   Codeine, Erythromycin, Ivp dye [iodinated diagnostic agents], and Morphine and related   Review of Systems Review of Systems  Constitutional:  Negative for activity change, appetite change, chills, diaphoresis, fatigue and fever.  HENT:  Negative for congestion, ear pain, postnasal drip, rhinorrhea, sinus pressure, sinus pain, sneezing and sore throat.   Eyes:  Negative for pain.  Respiratory:  Negative for cough, chest tightness and shortness of breath.   Cardiovascular:  Negative for chest pain and palpitations.  Gastrointestinal:  Negative for abdominal pain, diarrhea, nausea and vomiting.  Genitourinary:  Negative for dysuria.  Musculoskeletal:  Negative for back pain, myalgias and neck pain.  Skin:  Positive for rash. Negative for pallor and wound.  Allergic/Immunologic: Negative for environmental allergies and food allergies.  Neurological:  Negative for dizziness, light-headedness, numbness and headaches.  All other systems reviewed and are negative.   Physical Exam Triage Vital Signs ED Triage Vitals  Enc Vitals Group     BP 04/25/21 1829 (!) 131/93     Pulse Rate 04/25/21 1829 89     Resp 04/25/21 1829 18     Temp 04/25/21 1829 98.2 F (36.8 C)     Temp Source 04/25/21 1829 Oral     SpO2 04/25/21 1829 100 %     Weight 04/25/21 1827 210 lb 1.6 oz (95.3 kg)     Height 04/25/21 1827 5\' 6"  (1.676 m)     Head Circumference --      Peak Flow --      Pain Score 04/25/21 1826 4     Pain Loc --      Pain Edu? --      Excl. in GC? --    No data found.  Updated Vital Signs BP (!) 131/93 (BP Location: Left Arm)   Pulse 89   Temp 98.2 F (36.8 C) (Oral)   Resp 18   Ht 5\' 6"  (1.676 m)   Wt 95.3 kg   SpO2 100%    BMI 33.91 kg/m   Visual Acuity Right Eye Distance:   Left Eye Distance:   Bilateral Distance:    Right Eye Near:   Left Eye Near:    Bilateral Near:     Physical Exam Vitals and nursing note reviewed.  Constitutional:      General: She is not in acute distress.  Appearance: Normal appearance. She is not ill-appearing, toxic-appearing or diaphoretic.  HENT:     Head: Normocephalic and atraumatic.     Nose: Nose normal. No congestion.     Mouth/Throat:     Mouth: Mucous membranes are moist.  Eyes:     General: No scleral icterus.    Conjunctiva/sclera: Conjunctivae normal.     Pupils: Pupils are equal, round, and reactive to light.  Cardiovascular:     Rate and Rhythm: Normal rate and regular rhythm.     Pulses: Normal pulses.     Heart sounds: Normal heart sounds. No murmur heard.   No friction rub. No gallop.  Pulmonary:     Effort: Pulmonary effort is normal. No respiratory distress.     Breath sounds: Normal breath sounds. No stridor. No wheezing, rhonchi or rales.  Musculoskeletal:     Cervical back: Normal range of motion and neck supple.  Skin:    General: Skin is warm and dry.     Capillary Refill: Capillary refill takes less than 2 seconds.     Coloration: Skin is not jaundiced.     Findings: Erythema and rash present. No bruising or lesion.     Comments: Diffuse raised erythematous rash on the right upper extremity and to a lesser extent on the left upper extremity.  Consistent with contact dermatitis.  No open wounds or active infection.  No drainage.  There is excoriations from the pruritus.  Neurological:     General: No focal deficit present.     Mental Status: She is alert and oriented to person, place, and time.     UC Treatments / Results  Labs (all labs ordered are listed, but only abnormal results are displayed) Labs Reviewed - No data to display  EKG   Radiology No results found.  Procedures Procedures (including critical care  time)  Medications Ordered in UC Medications  methylPREDNISolone sodium succinate (SOLU-MEDROL) 125 mg/2 mL injection 125 mg (125 mg Intramuscular Given 04/25/21 1855)    Initial Impression / Assessment and Plan / UC Course  I have reviewed the triage vital signs and the nursing notes.  Pertinent labs & imaging results that were available during my care of the patient were reviewed by me and considered in my medical decision making (see chart for details).  Clinical impression: Diffuse contact dermatitis rash right greater than left arms with associated pruritus.  Treatment plan: 1.  The findings and treatment plan were discussed in detail with the patient.  Patient was in agreement. 2.  Recommended Solu-Medrol 125 mg IM and it was dispensed and given to the patient in the office today. 3.  I prescribed a steroid taper that she will start tomorrow.  She has no issues with her blood sugars. 4.  Prescribed Atarax which she can use for itch and she can supplement that with oral and topical Benadryl as well as topical calamine lotion and topical hydrocortisone cream. 5.  Educational handouts provided. 6.  Red flag signs and symptoms are discussed in detail including shortness of breath, difficulty breathing, sensation of throat is closing, chest pain.  If this was to recur she knows to call 911 and/or go to the nearest emergency room.  She voiced verbal understanding. 7.  Work note was not needed. 8.  She was discharged from care in stable condition and she will follow-up here as needed.    Final Clinical Impressions(s) / UC Diagnoses   Final diagnoses:  Rash and nonspecific skin eruption  Allergic contact dermatitis, unspecified trigger  Pruritic dermatitis     Discharge Instructions      As we discussed, you have contact dermatitis.  It is allergic in nature. I did give you a injection of Solu-Medrol steroid. I prescribed oral steroids which she will start tomorrow. I also  prescribed Atarax which is for your itch. You can use oral Benadryl, topical Benadryl, topical calamine lotion, topical hydrocortisone as symptoms require. Please see educational handouts. If symptoms persist please see your primary care provider. If they worsen in any way or you develop any shortness of breath, chest pain, a sensation your throat is closing please call 911 and go to the nearest emergency room.     ED Prescriptions     Medication Sig Dispense Auth. Provider   predniSONE (STERAPRED UNI-PAK 21 TAB) 10 MG (21) TBPK tablet Take by mouth daily. Take 6 tabs by mouth daily  for 2 days, then 5 tabs for 2 days, then 4 tabs for 2 days, then 3 tabs for 2 days, 2 tabs for 2 days, then 1 tab by mouth daily for 2 days. Start on 04/26/21 42 tablet Delton SeeBarnes, Keniah Klemmer, MD   hydrOXYzine (ATARAX/VISTARIL) 25 MG tablet Take 1 tablet (25 mg total) by mouth every 6 (six) hours. 12 tablet Delton SeeBarnes, Samya Siciliano, MD      PDMP not reviewed this encounter.   Delton SeeBarnes, Maisa Bedingfield, MD 04/25/21 [id number]

## 2021-04-25 NOTE — ED Triage Notes (Signed)
Patient states that she has a rash on both arms that she is concerned she may have got poison oak/ivy. States that this occurred after eating lunch outside x Sunday.

## 2021-04-25 NOTE — Discharge Instructions (Addendum)
As we discussed, you have contact dermatitis.  It is allergic in nature. I did give you a injection of Solu-Medrol steroid. I prescribed oral steroids which she will start tomorrow. I also prescribed Atarax which is for your itch. You can use oral Benadryl, topical Benadryl, topical calamine lotion, topical hydrocortisone as symptoms require. Please see educational handouts. If symptoms persist please see your primary care provider. If they worsen in any way or you develop any shortness of breath, chest pain, a sensation your throat is closing please call 911 and go to the nearest emergency room.

## 2021-05-10 ENCOUNTER — Other Ambulatory Visit: Payer: Self-pay

## 2021-05-10 ENCOUNTER — Other Ambulatory Visit: Payer: Commercial Managed Care - PPO

## 2021-05-10 DIAGNOSIS — R232 Flushing: Secondary | ICD-10-CM

## 2021-05-10 DIAGNOSIS — E038 Other specified hypothyroidism: Secondary | ICD-10-CM

## 2021-05-11 LAB — T3, FREE: T3, Free: 2.8 pg/mL (ref 2.0–4.4)

## 2021-05-11 LAB — TSH: TSH: 0.473 u[IU]/mL (ref 0.450–4.500)

## 2021-05-11 LAB — FOLLICLE STIMULATING HORMONE: FSH: 6.8 m[IU]/mL

## 2021-05-11 LAB — T4, FREE: Free T4: 1.27 ng/dL (ref 0.82–1.77)

## 2021-05-25 ENCOUNTER — Ambulatory Visit (INDEPENDENT_AMBULATORY_CARE_PROVIDER_SITE_OTHER): Payer: Commercial Managed Care - PPO | Admitting: Family Medicine

## 2021-05-25 ENCOUNTER — Encounter: Payer: Self-pay | Admitting: Family Medicine

## 2021-05-25 ENCOUNTER — Other Ambulatory Visit: Payer: Self-pay

## 2021-05-25 VITALS — BP 142/88 | HR 78 | Ht 65.0 in | Wt 197.0 lb

## 2021-05-25 DIAGNOSIS — N920 Excessive and frequent menstruation with regular cycle: Secondary | ICD-10-CM

## 2021-05-25 DIAGNOSIS — R635 Abnormal weight gain: Secondary | ICD-10-CM | POA: Diagnosis not present

## 2021-05-25 MED ORDER — NORETHIN ACE-ETH ESTRAD-FE 1-20 MG-MCG(24) PO TABS: 1.0000 | ORAL_TABLET | Freq: Every day | ORAL | 11 refills | Status: DC

## 2021-05-25 MED ORDER — PHENTERMINE HCL 37.5 MG PO CAPS: 37.5000 mg | ORAL_CAPSULE | ORAL | 1 refills | Status: DC

## 2021-05-25 NOTE — Assessment & Plan Note (Signed)
Given age, will try low dose OC's to help with bleeding and peri-menopause symptoms. Advised warning of DVT and PE. She is not concerned. Alternatively could try IUD +/- estrogen, but if still cycling, this is less desirable. Will consider IUD alone if needed.

## 2021-05-25 NOTE — Progress Notes (Signed)
   Subjective:    Patient ID: Sherri [name] is a 45 y.o. female presenting with Follow-up (Hot flashes)  on 05/25/2021  HPI: F/u of abnormal bleeding. Reports previous IUD x 3 without success. Has been on OCs in the past. Most of her issues right now are with hot flashes, night sweats, vaginal dryness. FSH was not c/w menopause, but symptoms certainly are. Mother has h/o DVT.  Review of Systems  Constitutional:  Negative for chills and fever.  Respiratory:  Negative for shortness of breath.   Cardiovascular:  Negative for chest pain.  Gastrointestinal:  Negative for abdominal pain, nausea and vomiting.  Genitourinary:  Negative for dysuria.  Skin:  Negative for rash.     Objective:    BP (!) 142/88   Pulse 78   Ht 5\' 5"  (1.651 m)   Wt 197 lb (89.4 kg)   BMI 32.78 kg/m  Physical Exam Constitutional:      General: She is not in acute distress.    Appearance: She is well-developed.  HENT:     Head: Normocephalic and atraumatic.  Eyes:     General: No scleral icterus. Cardiovascular:     Rate and Rhythm: Normal rate.  Pulmonary:     Effort: Pulmonary effort is normal.  Abdominal:     Palpations: Abdomen is soft.  Musculoskeletal:     Cervical back: Neck supple.  Skin:    General: Skin is warm and dry.  Neurological:     Mental Status: She is alert and oriented to person, place, and time.        Assessment & Plan:   Problem List Items Addressed This Visit       Unprioritized   Menorrhagia - Primary    Given age, will try low dose OC's to help with bleeding and peri-menopause symptoms. Advised warning of DVT and PE. She is not concerned. Alternatively could try IUD +/- estrogen, but if still cycling, this is less desirable. Will consider IUD alone if needed.       Relevant Medications   Norethindrone Acetate-Ethinyl Estrad-FE (LOESTRIN 24 FE) 1-20 MG-MCG(24) tablet   Other Visit Diagnoses     Weight gain       trial of phentermine--risks reviewed    Relevant Medications   phentermine 37.5 MG capsule       Return in about 6 weeks (around 07/06/2021).  07/08/2021 05/25/2021 3:31 PM

## 2021-06-04 ENCOUNTER — Telehealth: Payer: Commercial Managed Care - PPO | Admitting: Family

## 2021-06-04 DIAGNOSIS — J069 Acute upper respiratory infection, unspecified: Secondary | ICD-10-CM

## 2021-06-04 MED ORDER — FLUTICASONE PROPIONATE 50 MCG/ACT NA SUSP: 2.0000 | Freq: Every day | NASAL | 6 refills | Status: DC

## 2021-06-04 MED ORDER — BENZONATATE 100 MG PO CAPS: 100.0000 mg | ORAL_CAPSULE | Freq: Three times a day (TID) | ORAL | 0 refills | Status: DC | PRN

## 2021-06-04 NOTE — Progress Notes (Signed)

## 2021-06-05 ENCOUNTER — Telehealth: Payer: Commercial Managed Care - PPO | Admitting: Physician Assistant

## 2021-06-05 DIAGNOSIS — U071 COVID-19: Secondary | ICD-10-CM

## 2021-06-05 MED ORDER — ALBUTEROL SULFATE HFA 108 (90 BASE) MCG/ACT IN AERS: 2.0000 | INHALATION_SPRAY | Freq: Four times a day (QID) | RESPIRATORY_TRACT | 0 refills | Status: DC | PRN

## 2021-06-05 MED ORDER — PROMETHAZINE-DM 6.25-15 MG/5ML PO SYRP
5.0000 mL | ORAL_SOLUTION | Freq: Four times a day (QID) | ORAL | 0 refills | Status: DC | PRN
Start: 2021-06-05 — End: 2021-08-03

## 2021-06-05 NOTE — Progress Notes (Signed)
E-Visit  for Positive Covid Test Result  We are sorry you are not feeling well. We are here to help!  You have tested positive for COVID-19, meaning that you were infected with the novel coronavirus and could give the virus to others.  It is vitally important that you stay home so you do not spread it to others.      Please continue isolation at home, for at least 10 days since the start of your symptoms and until you have had 24 hours with no fever (without taking a fever reducer) and with improving of symptoms.  If you have no symptoms but tested positive (or all symptoms resolve after 5 days and you have no fever) you can leave your house but continue to wear a mask around others for an additional 5 days. If you have a fever,continue to stay home until you have had 24 hours of no fever. Most cases improve 5-10 days from onset but we have seen a small number of patients who have gotten worse after the 10 days.  Please be sure to watch for worsening symptoms and remain taking the proper precautions.   Go to the nearest hospital ED for assessment if fever/cough/breathlessness are severe or illness seems like a threat to life.    The following symptoms may appear 2-14 days after exposure: Fever Cough Shortness of breath or difficulty breathing Chills Repeated shaking with chills Muscle pain Headache Sore throat New loss of taste or smell Fatigue Congestion or runny nose Nausea or vomiting Diarrhea  You have been enrolled in MyChart Home Monitoring for COVID-19. Daily you will receive a questionnaire within the MyChart website. Our COVID-19 response team will be monitoring your responses daily.  You can use medication such as  prescription inhaler called Albuterol MDI 90 mcg /actuation 2 puffs every 4 hours as needed for shortness of breath, wheezing, cough and prescription cough medication called Phenergan DM 6.25 mg/15 mg. You make take one teaspoon / 5 ml every 4-6 hours as needed  for cough  You may also take acetaminophen (Tylenol) as needed for fever.  If you desire the FDA-emergency use antiviral medication options, we do require a video visit for that. To access a video visit please select the "Virtual Urgent Care Visit" option in the [employer].  Also, a work note will be provided through Pharmacologist. It should be known we do not complete FMLA paperwork, or Letter of Absence paperwork. If this is required, you will need to see your PCP for this. We apologize for this inconvenience.  HOME CARE: Only take medications as instructed by your medical team. Drink plenty of fluids and get plenty of rest. A steam or ultrasonic humidifier can help if you have congestion.   GET HELP RIGHT AWAY IF YOU HAVE EMERGENCY WARNING SIGNS.  Call 911 or proceed to your closest emergency facility if: You develop worsening high fever. Trouble breathing Bluish lips or face Persistent pain or pressure in the chest New confusion Inability to wake or stay awake You cough up blood. Your symptoms become more severe Inability to hold down food or fluids  This list is not all possible symptoms. Contact your medical provider for any symptoms that are severe or concerning to you.    Your e-visit answers were reviewed by a board certified advanced clinical practitioner to complete your personal care plan.  Depending on the condition, your plan could have included both over the counter or prescription medications.  If there is a problem please reply once you have received a response from your provider.  Your safety is important to [place].  If you have drug allergies check your prescription carefully.    You can use MyChart to ask questions about today's visit, request a non-urgent call back, or ask for a work or school excuse for 24 hours related to this e-Visit. If it has been greater than 24 hours you will need to follow up with your provider, or enter a new e-Visit to address those  concerns. You will get an e-mail in the next two days asking about your experience.  I hope that your e-visit has been valuable and will speed your recovery. Thank you for using e-visits.  I provided 6 minutes of non face-to-face time during this encounter for chart review and documentation.

## 2021-06-07 ENCOUNTER — Ambulatory Visit: Payer: Commercial Managed Care - PPO | Admitting: Family Medicine

## 2021-06-26 ENCOUNTER — Ambulatory Visit
Admission: EM | Admit: 2021-06-26 | Discharge: 2021-06-26 | Disposition: A | Discharge status: Home / Self Care | Payer: Commercial Managed Care - PPO | Attending: Physician Assistant | Admitting: Physician Assistant

## 2021-06-26 ENCOUNTER — Telehealth: Payer: Self-pay | Admitting: Family Medicine

## 2021-06-26 DIAGNOSIS — N10 Acute tubulo-interstitial nephritis: Secondary | ICD-10-CM | POA: Diagnosis present

## 2021-06-26 LAB — URINALYSIS, COMPLETE (UACMP) WITH MICROSCOPIC: WBC, UA: >50 WBC/hpf (ref 0–5)

## 2021-06-26 MED ORDER — KETOROLAC TROMETHAMINE 60 MG/2ML IM SOLN
30.0000 mg | Freq: Once | INTRAMUSCULAR | Status: AC
  Administered 2021-06-26: 30 mg via INTRAMUSCULAR

## 2021-06-26 MED ORDER — CIPROFLOXACIN HCL 500 MG PO TABS: 500.0000 mg | ORAL_TABLET | Freq: Two times a day (BID) | ORAL | 0 refills | Status: DC

## 2021-06-26 NOTE — Telephone Encounter (Signed)
Pt has a bladder infection and went to the urgent care

## 2021-06-26 NOTE — Discharge Instructions (Addendum)
-  Ciprofloxacin: 1 tablet twice a day for 10 days -Can use over-the-counter ibuprofen or Tylenol as needed for pain. -Push fluids -Urine to be sent for culture to ensure proper antibiotic coverage -Return to clinic or follow-up with primary care provider should symptoms worsen or not improve.

## 2021-06-26 NOTE — ED Provider Notes (Signed)
[facility]    CSN: [id number] Arrival date & time: 06/26/21  1325      History   Chief Complaint No chief complaint on file.   HPI Sherri [name] is a 45 y.o. female.   Patient is a 45 year old female who presents with concern for having a UTI.  Patient states her symptoms began about a week ago and that she has taken 2 boxes of Azo since then.  States she has pain with urination and frequency.  She states left-sided flank pain began on Saturday.  She states she went to work today and was advised to be seen due to her pain and ill appearance.  Patient reports she has an allergy to erythromycin.   Past Medical History:  Diagnosis Date   Abnormal uterine bleeding (AUB)    Chronic back pain    lower back from fall summer 2021   Complication of anesthesia    slow to awaken in past   Hashimoto's thyroiditis 2020   Hypothyroidism    PONV (postoperative nausea and vomiting)    takes iv nausea med    Patient Active Problem List   Diagnosis Date Noted   Ulnar neuropathy of right upper extremity 03/06/2021   Hypothyroid 09/16/2020   Menorrhagia 03/22/2020    Past Surgical History:  Procedure Laterality Date   CESAREAN SECTION  2003   x 1   DILITATION & CURRETTAGE/HYSTROSCOPY WITH HYDROTHERMAL ABLATION N/A 09/28/2020   Procedure: DILATATION & CURETTAGE/HYSTEROSCOPY WITH HYDROTHERMAL ABLATION;  Surgeon: Reva Bores, MD;  Location: [facility];  Service: Gynecology;  Laterality: N/A;   TONSILLECTOMY  1990's   TUBAL LIGATION  2003    OB History   No obstetric history on file.      Home Medications    Prior to Admission medications   Medication Sig Start Date End Date Taking? Authorizing Provider  ciprofloxacin (CIPRO) 500 MG tablet Take 1 tablet (500 mg total) by mouth 2 (two) times daily. 06/26/21  Yes Candis Schatz, PA-C  albuterol (VENTOLIN HFA) 108 (90 Base) MCG/ACT inhaler Inhale 2 puffs into the lungs every 6 (six) hours as needed  for wheezing or shortness of breath. 06/05/21   Burnette, Alessandra Bevels, PA-C  benzonatate (TESSALON PERLES) 100 MG capsule Take 1 capsule (100 mg total) by mouth 3 (three) times daily as needed. 06/04/21   Junie Spencer, FNP  fluticasone (FLONASE) 50 MCG/ACT nasal spray Place 2 sprays into both nostrils daily. 06/04/21   Jannifer Rodney A, FNP  gabapentin (NEURONTIN) 300 MG capsule Take 300 mg by mouth 3 (three) times daily. 04/30/21   [provider]  levothyroxine (SYNTHROID) 25 MCG tablet Take 25 mcg by mouth every morning. 03/16/20   [provider]  Multiple Vitamins-Minerals (MULTIVITAMIN WITH MINERALS) tablet Take 1 tablet by mouth daily.    [provider]  Norethindrone Acetate-Ethinyl Estrad-FE (LOESTRIN 24 FE) 1-20 MG-MCG(24) tablet Take 1 tablet by mouth daily. 05/25/21   Reva Bores, MD  omeprazole (PRILOSEC) 40 MG capsule Take 40 mg by mouth daily. 01/22/21   [provider]  OVER THE COUNTER MEDICATION Biotin 1 daily    [provider]  OVER THE COUNTER MEDICATION Zinc 1 daily    [provider]  OVER THE COUNTER MEDICATION Iron 1 daily    [provider]  OVER THE COUNTER MEDICATION Vitamin d 1 daily    [provider]  phentermine 37.5 MG capsule Take 1 capsule (37.5 mg  total) by mouth every morning. 05/25/21   Reva Bores, MD  promethazine-dextromethorphan (PROMETHAZINE-DM) 6.25-15 MG/5ML syrup Take 5 mLs by mouth 4 (four) times daily as needed for cough. 06/05/21   Margaretann Loveless, PA-C    Family History Family History  Problem Relation Age of Onset   Healthy Mother    Bladder Cancer Father    Lung cancer Father    Mitral valve prolapse Father    Thyroid disease Neg Hx     Social History Social History   Tobacco Use   Smoking status: Never   Smokeless tobacco: Never  Vaping Use   Vaping Use: Never used  Substance Use Topics   Alcohol use: No   Drug use: No     Allergies   Codeine,  Erythromycin, Ivp dye [iodinated diagnostic agents], and Morphine and related   Review of Systems Review of Systems as noted above in HPI.  Other systems reviewed and found be negative   Physical Exam Triage Vital Signs ED Triage Vitals  Enc Vitals Group     BP 06/26/21 1410 137/84     Pulse Rate 06/26/21 1410 98     Resp 06/26/21 1410 16     Temp 06/26/21 1410 98 F (36.7 C)     Temp Source 06/26/21 1408 Oral     SpO2 06/26/21 1410 96 %     Weight --      Height --      Head Circumference --      Peak Flow --      Pain Score 06/26/21 1407 8     Pain Loc --      Pain Edu? --      Excl. in GC? --    No data found.  Updated Vital Signs BP 137/84 (BP Location: Left Arm)   Pulse 98   Temp 98 F (36.7 C) (Oral)   Resp 16   SpO2 96%    Physical Exam Constitutional:      Appearance: Normal appearance.  Cardiovascular:     Rate and Rhythm: Normal rate and regular rhythm.  Pulmonary:     Effort: Pulmonary effort is normal.  Abdominal:     General: Abdomen is flat.     Tenderness: There is left CVA tenderness (L flank pain does come around to front as well). There is no right CVA tenderness.  Neurological:     Mental Status: She is alert.     UC Treatments / Results  Labs (all labs ordered are listed, but only abnormal results are displayed) Labs Reviewed  URINALYSIS, COMPLETE (UACMP) WITH MICROSCOPIC - Abnormal; Notable for the following components:      Result Value   Color, Urine ORANGE (*)    APPearance CLOUDY (*)    Glucose, UA   (*)    Value: TEST NOT REPORTED DUE TO COLOR INTERFERENCE OF URINE PIGMENT   Hgb urine dipstick   (*)    Value: TEST NOT REPORTED DUE TO COLOR INTERFERENCE OF URINE PIGMENT   Bilirubin Urine   (*)    Value: TEST NOT REPORTED DUE TO COLOR INTERFERENCE OF URINE PIGMENT   Ketones, ur   (*)    Value: TEST NOT REPORTED DUE TO COLOR INTERFERENCE OF URINE PIGMENT   Protein, ur   (*)    Value: TEST NOT REPORTED DUE TO COLOR  INTERFERENCE OF URINE PIGMENT   Nitrite   (*)    Value: TEST NOT REPORTED DUE TO COLOR INTERFERENCE OF  URINE PIGMENT   Leukocytes,Ua   (*)    Value: TEST NOT REPORTED DUE TO COLOR INTERFERENCE OF URINE PIGMENT   Bacteria, UA MANY (*)    All other components within normal limits  URINE CULTURE    EKG   Radiology No results found.  Procedures Procedures (including critical care time)  Medications Ordered in UC Medications  ketorolac (TORADOL) injection 30 mg (30 mg Intramuscular Given 06/26/21 1508)    Initial Impression / Assessment and Plan / UC Course  I have reviewed the triage vital signs and the nursing notes.  Pertinent labs & imaging results that were available during my care of the patient were reviewed by me and considered in my medical decision making (see chart for details).  Patient with 1 week history of symptoms for UTI including with pain with urination and frequency.  Flank pain started on Saturday.  Many bacteria on urinalysis, other test unable to be done due to the orange coloration from the Azo.  Given her severe kidney pain, patient was given a dose of Toradol IM  Patient given prescription for Cipro for her acute pyelonephritis.  Urine sent for culture.  Instructions as below.  Final Clinical Impressions(s) / UC Diagnoses   Final diagnoses:  Acute pyelonephritis     Discharge Instructions      -Ciprofloxacin: 1 tablet twice a day for 10 days -Can use over-the-counter ibuprofen or Tylenol as needed for pain. -Push fluids -Urine to be sent for culture to ensure proper antibiotic coverage -Return to clinic or follow-up with primary care provider should symptoms worsen or not improve.     ED Prescriptions     Medication Sig Dispense Auth. Provider   ciprofloxacin (CIPRO) 500 MG tablet Take 1 tablet (500 mg total) by mouth 2 (two) times daily. 20 tablet Candis Schatz, PA-C      PDMP not reviewed this encounter.   Candis Schatz,  PA-C 06/26/21 1815

## 2021-06-26 NOTE — ED Triage Notes (Signed)
Pt states she thought was having a UTI, 4 days ago and started taking the AZO pills with little relief, started having back left pain Saturday.

## 2021-06-29 ENCOUNTER — Ambulatory Visit (INDEPENDENT_AMBULATORY_CARE_PROVIDER_SITE_OTHER): Payer: Commercial Managed Care - PPO

## 2021-06-29 ENCOUNTER — Ambulatory Visit
Admission: EM | Admit: 2021-06-29 | Discharge: 2021-06-29 | Disposition: A | Discharge status: Home / Self Care | Payer: Commercial Managed Care - PPO | Attending: Family Medicine | Admitting: Family Medicine

## 2021-06-29 ENCOUNTER — Other Ambulatory Visit: Payer: Self-pay

## 2021-06-29 DIAGNOSIS — R109 Unspecified abdominal pain: Secondary | ICD-10-CM | POA: Diagnosis not present

## 2021-06-29 DIAGNOSIS — N12 Tubulo-interstitial nephritis, not specified as acute or chronic: Secondary | ICD-10-CM | POA: Diagnosis present

## 2021-06-29 LAB — URINE CULTURE: Culture: >=100000 — AB

## 2021-06-29 LAB — URINALYSIS, COMPLETE (UACMP) WITH MICROSCOPIC
Bilirubin Urine: NEGATIVE
Glucose, UA: NEGATIVE mg/dL
Ketones, ur: NEGATIVE mg/dL
Nitrite: NEGATIVE
Protein, ur: 30 mg/dL — AB
Specific Gravity, Urine: >1.03 — ABNORMAL HIGH (ref 1.005–1.030)
pH: 5.5 (ref 5.0–8.0)

## 2021-06-29 LAB — PREGNANCY, URINE: Preg Test, Ur: NEGATIVE

## 2021-06-29 MED ORDER — CEFDINIR 300 MG PO CAPS: 300.0000 mg | ORAL_CAPSULE | Freq: Two times a day (BID) | ORAL | 0 refills | Status: DC

## 2021-06-29 MED ORDER — CEFTRIAXONE SODIUM 1 G IJ SOLR
1.0000 g | Freq: Once | INTRAMUSCULAR | Status: AC
  Administered 2021-06-29: 1 g via INTRAMUSCULAR

## 2021-06-29 NOTE — Discharge Instructions (Addendum)
Stop the Cipro.  Start the new antibiotic.  If you fail to resolve please let me know and I will refer you to urology.  If you worsen, go to the ER.  Take care  Dr. Adriana Simas

## 2021-06-29 NOTE — ED Provider Notes (Signed)
[facility]    CSN: [id number] Arrival date & time: 06/29/21  1055      History   Chief Complaint Chief Complaint  Patient presents with   Flank Pain    left    HPI 45 year old Sherri [name] presents with the above complaint.  Patient seen here on Monday 8/15.  Diagnosed with pyelonephritis.  Treated with Cipro.  Culture has returned positive for E. coli which is sensitive to Cipro.  Patient states that she has improved some but is still having symptoms.  She still having flank pain/back pain on the left side and is also still having dysuria, and urgency/frequency.  No current fever.  She endorses compliance with antibiotic therapy.  She is concerned that she has not fully gotten better.  No nausea vomiting.  No other complaints at this time.  Past Medical History:  Diagnosis Date   Abnormal uterine bleeding (AUB)    Chronic back pain    lower back from fall summer 2021   Complication of anesthesia    slow to awaken in past   Hashimoto's thyroiditis 2020   Hypothyroidism    PONV (postoperative nausea and vomiting)    takes iv nausea med    Patient Active Problem List   Diagnosis Date Noted   Ulnar neuropathy of right upper extremity 03/06/2021   Hypothyroid 09/16/2020   Menorrhagia 03/22/2020    Past Surgical History:  Procedure Laterality Date   CESAREAN SECTION  2003   x 1   DILITATION & CURRETTAGE/HYSTROSCOPY WITH HYDROTHERMAL ABLATION N/A 09/28/2020   Procedure: DILATATION & CURETTAGE/HYSTEROSCOPY WITH HYDROTHERMAL ABLATION;  Surgeon: Reva Bores, MD;  Location: [facility];  Service: Gynecology;  Laterality: N/A;   TONSILLECTOMY  1990's   TUBAL LIGATION  2003    OB History   No obstetric history on file.      Home Medications    Prior to Admission medications   Medication Sig Start Date End Date Taking? Authorizing Provider  cefdinir (OMNICEF) 300 MG capsule Take 1 capsule (300 mg total) by mouth 2 (two) times daily. 06/29/21   Yes Verla Bryngelson G, DO  albuterol (VENTOLIN HFA) 108 (90 Base) MCG/ACT inhaler Inhale 2 puffs into the lungs every 6 (six) hours as needed for wheezing or shortness of breath. 06/05/21   Burnette, Alessandra Bevels, PA-C  benzonatate (TESSALON PERLES) 100 MG capsule Take 1 capsule (100 mg total) by mouth 3 (three) times daily as needed. 06/04/21   Junie Spencer, FNP  fluticasone (FLONASE) 50 MCG/ACT nasal spray Place 2 sprays into both nostrils daily. 06/04/21   Jannifer Rodney A, FNP  gabapentin (NEURONTIN) 300 MG capsule Take 300 mg by mouth 3 (three) times daily. 04/30/21   [provider]  levothyroxine (SYNTHROID) 25 MCG tablet Take 25 mcg by mouth every morning. 03/16/20   [provider]  Multiple Vitamins-Minerals (MULTIVITAMIN WITH MINERALS) tablet Take 1 tablet by mouth daily.    [provider]  Norethindrone Acetate-Ethinyl Estrad-FE (LOESTRIN 24 FE) 1-20 MG-MCG(24) tablet Take 1 tablet by mouth daily. 7/Sherri/22   Reva Bores, MD  omeprazole (PRILOSEC) 40 MG capsule Take 40 mg by mouth daily. 01/22/21   [provider]  OVER THE COUNTER MEDICATION Biotin 1 daily    [provider]  OVER THE COUNTER MEDICATION Zinc 1 daily    [provider]  OVER THE COUNTER MEDICATION Iron 1 daily    [provider]  OVER THE COUNTER MEDICATION Vitamin d 1  daily    [provider]  phentermine 37.5 MG capsule Take 1 capsule (37.5 mg total) by mouth every morning. 7/Sherri/22   Reva Bores, MD  promethazine-dextromethorphan (PROMETHAZINE-DM) 6.25-15 MG/5ML syrup Take 5 mLs by mouth 4 (four) times daily as needed for cough. 06/05/21   Margaretann Loveless, PA-C    Family History Family History  Problem Relation Age of Onset   Healthy Mother    Bladder Cancer Father    Lung cancer Father    Mitral valve prolapse Father    Thyroid disease Neg Hx     Social History Social History   Tobacco Use   Smoking status: Never   Smokeless  tobacco: Never  Vaping Use   Vaping Use: Never used  Substance Use Topics   Alcohol use: No   Drug use: No     Allergies   Codeine, Erythromycin, Ivp dye [iodinated diagnostic agents], and Morphine and related   Review of Systems Review of Systems Per HPI  Physical Exam Triage Vital Signs ED Triage Vitals  Enc Vitals Group     BP 06/29/21 1111 121/78     Pulse Rate 06/29/21 1111 76     Resp 06/29/21 1111 17     Temp 06/29/21 1111 98.3 F (36.8 C)     Temp Source 06/29/21 1111 Oral     SpO2 06/29/21 1111 100 %     Weight 06/29/21 1109 197 lb 1.5 oz (89.4 kg)     Height 06/29/21 1109 5\' 5"  (1.651 m)     Head Circumference --      Peak Flow --      Pain Score 06/29/21 1244 0     Pain Loc --      Pain Edu? --      Excl. in GC? --    No data found.  Updated Vital Signs BP 121/78 (BP Location: Right Arm)   Pulse 76   Temp 98.3 F (36.8 C) (Oral)   Resp 17   Ht 5\' 5"  (1.651 m)   Wt 89.4 kg   SpO2 100%   BMI 32.80 kg/m   Visual Acuity Right Eye Distance:   Left Eye Distance:   Bilateral Distance:    Right Eye Near:   Left Eye Near:    Bilateral Near:     Physical Exam Vitals and nursing note reviewed.  Constitutional:      General: She is not in acute distress.    Appearance: Normal appearance. She is not ill-appearing.  HENT:     Head: Normocephalic and atraumatic.  Eyes:     General:        Right eye: No discharge.        Left eye: No discharge.     Conjunctiva/sclera: Conjunctivae normal.  Cardiovascular:     Rate and Rhythm: Normal rate and regular rhythm.  Pulmonary:     Effort: Pulmonary effort is normal.     Breath sounds: Normal breath sounds.  Abdominal:     Tenderness: There is left CVA tenderness.  Neurological:     Mental Status: She is alert.     UC Treatments / Results  Labs (all labs ordered are listed, but only abnormal results are displayed) Labs Reviewed  URINALYSIS, COMPLETE (UACMP) WITH MICROSCOPIC - Abnormal; Notable  for the following components:      Result Value   APPearance HAZY (*)    Specific Gravity, Urine >1.030 (*)    Hgb urine dipstick TRACE (*)  Protein, ur 30 (*)    Leukocytes,Ua TRACE (*)    Bacteria, UA FEW (*)    All other components within normal limits  PREGNANCY, URINE    EKG   Radiology DG Abd 1 View  Result Date: 06/29/2021 CLINICAL DATA:  History of UTI, LEFT flank pain with urinary urgency. EXAM: ABDOMEN - 1 VIEW COMPARISON:  None FINDINGS: Visualized lung bases are clear. Bowel gas pattern is unremarkable. Moderate stool throughout the colon in the ascending and transverse colon. No abnormal calcifications. On limited assessment no acute regional skeletal process. Soft tissues grossly unremarkable. Spinal degenerative changes greatest at L4-5 and L5-S1. IMPRESSION: 1. No acute findings. 2. Moderate stool throughout the colon. 3. Degenerative changes in the lower lumbar spine. Electronically Signed   By: Donzetta Kohut M.D.   On: 06/29/2021 12:26    Procedures Procedures (including critical care time)  Medications Ordered in UC Medications  cefTRIAXone (ROCEPHIN) injection 1 g (1 g Intramuscular Given 06/29/21 1242)    Initial Impression / Assessment and Plan / UC Course  I have reviewed the triage vital signs and the nursing notes.  Pertinent labs & imaging results that were available during my care of the patient were reviewed by me and considered in my medical decision making (see chart for details).    46 year old Sherri [name] presents with pyelonephritis.  Persistent symptoms.  KUB was obtained to assess for stone.  KUB was independently reviewed by me.  Interpretation: No evidence of stone.  Stopping Cipro.  Placing on Omnicef.  IM Rocephin given today.  Final Clinical Impressions(s) / UC Diagnoses   Final diagnoses:  Flank pain  Pyelonephritis     Discharge Instructions      Stop the Cipro.  Start the new antibiotic.  If you fail to resolve please let me  know and I will refer you to urology.  If you worsen, go to the ER.  Take care  Dr. Adriana Simas    ED Prescriptions     Medication Sig Dispense Auth. Provider   cefdinir (OMNICEF) 300 MG capsule Take 1 capsule (300 mg total) by mouth 2 (two) times daily. 20 capsule Tommie Sams, DO      PDMP not reviewed this encounter.   Tommie Sams, DO 06/29/21 1250

## 2021-06-29 NOTE — ED Triage Notes (Addendum)
Patient states that she was seen here on Monday and prescribed Cipro for a UTI. States that she has been taking the medication as prescribed but has continued to have left flank, urinary urgency and frequency and burning with urination. States that she has been having a fever as well, that does relieve with antipyretics.

## 2021-07-06 ENCOUNTER — Ambulatory Visit: Payer: Commercial Managed Care - PPO | Admitting: Family Medicine

## 2021-08-03 ENCOUNTER — Encounter: Payer: Self-pay | Admitting: Family Medicine

## 2021-08-03 ENCOUNTER — Other Ambulatory Visit: Payer: Self-pay

## 2021-08-03 ENCOUNTER — Ambulatory Visit (INDEPENDENT_AMBULATORY_CARE_PROVIDER_SITE_OTHER): Payer: Commercial Managed Care - PPO | Admitting: Family Medicine

## 2021-08-03 DIAGNOSIS — N920 Excessive and frequent menstruation with regular cycle: Secondary | ICD-10-CM

## 2021-08-03 DIAGNOSIS — R635 Abnormal weight gain: Secondary | ICD-10-CM

## 2021-08-03 DIAGNOSIS — R030 Elevated blood-pressure reading, without diagnosis of hypertension: Secondary | ICD-10-CM | POA: Diagnosis not present

## 2021-08-03 MED ORDER — PHENTERMINE HCL 37.5 MG PO CAPS: 37.5000 mg | ORAL_CAPSULE | ORAL | 1 refills | Status: DC

## 2021-08-03 NOTE — Progress Notes (Signed)
Pt presents for follow up BCP for irregular bleeding.  Pt reports intermittent vaginal spotting.  Needs refill on Phentermine

## 2021-08-03 NOTE — Progress Notes (Signed)
   Subjective:    Patient ID: Sherri [name] is a 46 y.o. female presenting with Follow-up  on 08/03/2021  HPI: Still having some spotting on continuous low dose COCs. Has not had any true cycle since beginning on birth control pills. Still having hot flashes, but vaginal dryness has improved dramatically.  Review of Systems  Constitutional:  Negative for chills and fever.  Respiratory:  Negative for shortness of breath.   Cardiovascular:  Negative for chest pain.  Gastrointestinal:  Negative for abdominal pain, nausea and vomiting.  Genitourinary:  Negative for dysuria.  Skin:  Negative for rash.     Objective:    BP (!) 153/93   Pulse 83   Wt 193 lb 9.6 oz (87.8 kg)   BMI 32.22 kg/m  Physical Exam Constitutional:      General: She is not in acute distress.    Appearance: She is well-developed.  HENT:     Head: Normocephalic and atraumatic.  Eyes:     General: No scleral icterus. Cardiovascular:     Rate and Rhythm: Normal rate.  Pulmonary:     Effort: Pulmonary effort is normal.  Abdominal:     Palpations: Abdomen is soft.  Musculoskeletal:     Cervical back: Neck supple.  Skin:    General: Skin is warm and dry.  Neurological:     Mental Status: She is alert and oriented to person, place, and time.        Assessment & Plan:   Problem List Items Addressed This Visit       Unprioritized   Menorrhagia    Improved with COCs for now. Having some spotting, as she gets used to this. Also, consider placebos q 3 months to allow a cycle and prevent on-going BTB.      Elevated blood pressure reading    Recheck was 151/90--will check at home. COC's may increase. Discussed risks of Phentermine with this.      Other Visit Diagnoses     Weight gain       trial of phentermine--risks reviewed   Relevant Medications   phentermine 37.5 MG capsule      Return in about 3 months (around 11/02/2021).  Reva Bores 08/04/2021 11:09 AM

## 2021-08-04 ENCOUNTER — Encounter: Payer: Self-pay | Admitting: Family Medicine

## 2021-08-04 DIAGNOSIS — R030 Elevated blood-pressure reading, without diagnosis of hypertension: Secondary | ICD-10-CM | POA: Insufficient documentation

## 2021-08-04 NOTE — Assessment & Plan Note (Signed)
Improved with COCs for now. Having some spotting, as she gets used to this. Also, consider placebos q 3 months to allow a cycle and prevent on-going BTB.

## 2021-08-04 NOTE — Assessment & Plan Note (Signed)
Recheck was 151/90--will check at home. COC's may increase. Discussed risks of Phentermine with this.

## 2021-08-09 DIAGNOSIS — N920 Excessive and frequent menstruation with regular cycle: Secondary | ICD-10-CM

## 2021-08-15 MED ORDER — NORGESTIMATE-ETH ESTRADIOL 0.25-35 MG-MCG PO TABS: 1.0000 | ORAL_TABLET | Freq: Every day | ORAL | 1 refills | Status: DC

## 2021-09-01 ENCOUNTER — Telehealth: Payer: Commercial Managed Care - PPO | Admitting: Family Medicine

## 2021-09-01 DIAGNOSIS — J0140 Acute pansinusitis, unspecified: Secondary | ICD-10-CM | POA: Diagnosis not present

## 2021-09-01 MED ORDER — AMOXICILLIN-POT CLAVULANATE 875-125 MG PO TABS: 1.0000 | ORAL_TABLET | Freq: Two times a day (BID) | ORAL | 0 refills | Status: AC

## 2021-09-01 NOTE — Progress Notes (Signed)

## 2021-09-25 MED ORDER — NORGESTIMATE-ETH ESTRADIOL 0.25-35 MG-MCG PO TABS: 1.0000 | ORAL_TABLET | Freq: Every day | ORAL | 1 refills | Status: DC

## 2021-10-09 ENCOUNTER — Encounter: Payer: Self-pay | Admitting: Family Medicine

## 2021-11-07 ENCOUNTER — Telehealth: Payer: Commercial Managed Care - PPO | Admitting: Emergency Medicine

## 2021-11-07 DIAGNOSIS — J329 Chronic sinusitis, unspecified: Secondary | ICD-10-CM | POA: Diagnosis not present

## 2021-11-07 MED ORDER — AMOXICILLIN-POT CLAVULANATE 875-125 MG PO TABS: 1.0000 | ORAL_TABLET | Freq: Two times a day (BID) | ORAL | 0 refills | Status: DC

## 2021-11-07 NOTE — Progress Notes (Signed)

## 2022-01-02 ENCOUNTER — Other Ambulatory Visit: Payer: Self-pay

## 2022-01-02 ENCOUNTER — Ambulatory Visit
Admission: EM | Admit: 2022-01-02 | Discharge: 2022-01-02 | Disposition: A | Discharge status: Home / Self Care | Payer: Commercial Managed Care - PPO | Attending: Emergency Medicine | Admitting: Emergency Medicine

## 2022-01-02 ENCOUNTER — Ambulatory Visit (INDEPENDENT_AMBULATORY_CARE_PROVIDER_SITE_OTHER): Payer: Commercial Managed Care - PPO

## 2022-01-02 DIAGNOSIS — M25571 Pain in right ankle and joints of right foot: Secondary | ICD-10-CM

## 2022-01-02 MED ORDER — PREDNISONE 20 MG PO TABS: 40.0000 mg | ORAL_TABLET | Freq: Every day | ORAL | 0 refills | Status: DC

## 2022-01-02 MED ORDER — CYCLOBENZAPRINE HCL 10 MG PO TABS: 10.0000 mg | ORAL_TABLET | Freq: Every day | ORAL | 0 refills | Status: DC

## 2022-01-02 MED ORDER — KETOROLAC TROMETHAMINE 60 MG/2ML IM SOLN
30.0000 mg | Freq: Once | INTRAMUSCULAR | Status: AC
  Administered 2022-01-02: 30 mg via INTRAMUSCULAR

## 2022-01-02 NOTE — Discharge Instructions (Signed)
Your x-ray today did not show injury to the bone, ligaments or tendons of your ankle. Your pain is most likely being caused by irritation to the soft tissues, this should improve as time progresses.   Starting tomorrow begin use of prednisone taking every morning with food as directed, while using this medication you may use 500 to 1000 mg of Tylenol every 6 hours throughout the day   You may use muscle relaxer at bedtime as needed for additional comfort  You may apply heat or ice, whichever makes you feel better, to affected area in 15 minute intervals  You may continue activity as tolerated, there is no injury therefore, it is important that you continue to move around so you do not loose strength to the area  you may wrap ankle with ace wrap for additional support while completing activities, once wrapped if you begin to experience numbness or tingling it is too tight, remove and redo, you should be able to easily fit one finger under wrap   If symptoms persist, you may follow up with orthopedic specialist for evaluation, an orthopedic doctor specializes in the bone, they may provide  management such as but not limited to imaging, long term medications and physical therapy

## 2022-01-02 NOTE — ED Provider Notes (Signed)
[facility]    CSN: [id number] Arrival date & time: 01/02/22  1901      History   Chief Complaint Chief Complaint  Patient presents with   Ankle Pain    HPI Sherri [name] is a 46 y.o. female.   Patient presents with pain and swelling to the right ankle for 2 weeks without precipitant event.  States that pain started abruptly upon awakening.  pain is described as throbbing.  Pain begins at the lateral aspect of her ankle extending into the midfoot with intermittent shooting pain to the lateral aspect of the lower leg.  Painful to bear weight.  Range of motion intact but elicits pain.  Denies numbness, tingling, trauma or injury.  Has attempted use of Tylenol, ibuprofen, ice and heat without any success.      Past Medical History:  Diagnosis Date   Abnormal uterine bleeding (AUB)    Chronic back pain    lower back from fall summer 2021   Complication of anesthesia    slow to awaken in past   Hashimoto's thyroiditis 2020   Hypothyroidism    PONV (postoperative nausea and vomiting)    takes iv nausea med    Patient Active Problem List   Diagnosis Date Noted   Elevated blood pressure reading 08/04/2021   Ulnar neuropathy of right upper extremity 03/06/2021   Hypothyroid 09/16/2020   Menorrhagia 03/22/2020    Past Surgical History:  Procedure Laterality Date   CESAREAN SECTION  2003   x 1   DILITATION & CURRETTAGE/HYSTROSCOPY WITH HYDROTHERMAL ABLATION N/A 09/28/2020   Procedure: DILATATION & CURETTAGE/HYSTEROSCOPY WITH HYDROTHERMAL ABLATION;  Surgeon: Reva Bores, MD;  Location: [facility] [place];  Service: Gynecology;  Laterality: N/A;   TONSILLECTOMY  1990's   TUBAL LIGATION  2003    OB History   No obstetric history on file.      Home Medications    Prior to Admission medications   Medication Sig Start Date End Date Taking? Authorizing Provider  amoxicillin-clavulanate (AUGMENTIN) 875-125 MG tablet Take 1 tablet by mouth every 12  (twelve) hours. 11/07/21   Roxy Horseman, PA-C  gabapentin (NEURONTIN) 300 MG capsule Take 300 mg by mouth 3 (three) times daily. 04/30/21   [provider]  norgestimate-ethinyl estradiol (ORTHO-CYCLEN) 0.25-35 MG-MCG tablet Take 1 tablet by mouth daily. 09/25/21   Reva Bores, MD  omeprazole (PRILOSEC) 40 MG capsule Take 40 mg by mouth daily. 01/22/21   [provider]  phentermine 37.5 MG capsule Take 1 capsule (37.5 mg total) by mouth every morning. 08/03/21   Reva Bores, MD    Family History Family History  Problem Relation Age of Onset   Healthy Mother    Bladder Cancer Father    Lung cancer Father    Mitral valve prolapse Father    Thyroid disease Neg Hx     Social History Social History   Tobacco Use   Smoking status: Never   Smokeless tobacco: Never  Vaping Use   Vaping Use: Never used  Substance Use Topics   Alcohol use: No   Drug use: No     Allergies   Codeine, Erythromycin, Ivp dye [iodinated contrast media], and Morphine and related   Review of Systems Review of Systems  Constitutional: Negative.   Respiratory: Negative.    Musculoskeletal:  Positive for joint swelling. Negative for arthralgias, back pain, gait problem, myalgias, neck pain and neck stiffness.  Skin: Negative.   Neurological: Negative.  Physical Exam Triage Vital Signs ED Triage Vitals  Enc Vitals Group     BP 01/02/22 1920 (!) 141/89     Pulse Rate 01/02/22 1920 84     Resp 01/02/22 1920 18     Temp 01/02/22 1920 98.5 F (36.9 C)     Temp Source 01/02/22 1920 Oral     SpO2 01/02/22 1920 99 %     Weight --      Height --      Head Circumference --      Peak Flow --      Pain Score 01/02/22 1921 5     Pain Loc --      Pain Edu? --      Excl. in GC? --    No data found.  Updated Vital Signs BP (!) 141/89 (BP Location: Left Arm)    Pulse 84    Temp 98.5 F (36.9 C) (Oral)    Resp 18    SpO2 99%   Visual Acuity Right Eye Distance:   Left Eye  Distance:   Bilateral Distance:    Right Eye Near:   Left Eye Near:    Bilateral Near:     Physical Exam Constitutional:      Appearance: Normal appearance.  HENT:     Head: Normocephalic.  Eyes:     Extraocular Movements: Extraocular movements intact.  Pulmonary:     Effort: Pulmonary effort is normal.  Musculoskeletal:     Comments: Tenderness, mild swelling and small effusion noted over the lateral malleolus, no ecchymosis noted, swelling throughout the anterior aspect of the ankle, able to bear weight, range of motion intact, 2+ dorsalis pedis pulse  Skin:    General: Skin is warm and dry.  Neurological:     Mental Status: She is alert and oriented to person, place, and time. Mental status is at baseline.  Psychiatric:        Mood and Affect: Mood normal.        Behavior: Behavior normal.     UC Treatments / Results  Labs (all labs ordered are listed, but only abnormal results are displayed) Labs Reviewed - No data to display  EKG   Radiology No results found.  Procedures Procedures (including critical care time)  Medications Ordered in UC Medications - No data to display  Initial Impression / Assessment and Plan / UC Course  I have reviewed the triage vital signs and the nursing notes.  Pertinent labs & imaging results that were available during my care of the patient were reviewed by me and considered in my medical decision making (see chart for details).  Acute right ankle pain  X-ray negative, discussed findings with patient, given Toradol IM injection here in office to help manage pain, prednisone course and muscle relaxer sent for outpatient use, recommended RICE, heat in 15-minute intervals, activity as tolerated and pillows for support, given walking referral to orthopedics for persistent symptoms after completion of medication Final Clinical Impressions(s) / UC Diagnoses   Final diagnoses:  None   Discharge Instructions   None    ED  Prescriptions   None    PDMP not reviewed this encounter.   Valinda Hoar, [place] 01/03/22 [phone]

## 2022-01-02 NOTE — ED Triage Notes (Signed)
Pt reports pain and swelling in right ankle and shooting pain to the right foot  x 2 weeks.Cold compress, Tylenol and ibuprofen gives no relief. Pain improves some with heat.

## 2022-01-23 ENCOUNTER — Telehealth: Payer: Commercial Managed Care - PPO | Admitting: Physician Assistant

## 2022-01-23 DIAGNOSIS — J019 Acute sinusitis, unspecified: Secondary | ICD-10-CM | POA: Diagnosis not present

## 2022-01-23 DIAGNOSIS — B9689 Other specified bacterial agents as the cause of diseases classified elsewhere: Secondary | ICD-10-CM | POA: Diagnosis not present

## 2022-01-23 MED ORDER — AMOXICILLIN-POT CLAVULANATE 875-125 MG PO TABS
1.0000 | ORAL_TABLET | Freq: Two times a day (BID) | ORAL | 0 refills | Status: DC
Start: 2022-01-23 — End: 2022-03-02

## 2022-01-23 NOTE — Progress Notes (Addendum)

## 2022-01-23 NOTE — Progress Notes (Signed)
I have spent 5 minutes in review of e-visit questionnaire, review and updating patient chart, medical decision making and response to patient.   Tyarra Nolton Cody Jazleen Robeck, PA-C    

## 2022-03-02 ENCOUNTER — Telehealth: Payer: Commercial Managed Care - PPO | Admitting: Physician Assistant

## 2022-03-02 DIAGNOSIS — J019 Acute sinusitis, unspecified: Secondary | ICD-10-CM | POA: Diagnosis not present

## 2022-03-02 DIAGNOSIS — B9689 Other specified bacterial agents as the cause of diseases classified elsewhere: Secondary | ICD-10-CM | POA: Diagnosis not present

## 2022-03-02 DIAGNOSIS — J302 Other seasonal allergic rhinitis: Secondary | ICD-10-CM

## 2022-03-02 MED ORDER — AMOXICILLIN-POT CLAVULANATE 875-125 MG PO TABS: 1.0000 | ORAL_TABLET | Freq: Two times a day (BID) | ORAL | 0 refills | Status: DC

## 2022-03-02 MED ORDER — FLUTICASONE PROPIONATE 50 MCG/ACT NA SUSP: 2.0000 | Freq: Every day | NASAL | 0 refills | Status: DC

## 2022-03-02 NOTE — Progress Notes (Signed)
E-Visit for Sinus Problems ? ?We are sorry that you are not feeling well.  Here is how we plan to help! ? ?Based on what you have shared with me it looks like you have sinusitis.  Sinusitis is inflammation and infection in the sinus cavities of the head.  Based on your presentation I believe you most likely have Acute Bacterial Sinusitis.  This is an infection caused by bacteria and is treated with antibiotics. I have prescribed Augmentin 875mg /125mg  one tablet twice daily with food, for 7 days. You may use an oral decongestant such as Mucinex D or if you have glaucoma or high blood pressure use plain Mucinex. Saline nasal spray help and can safely be used as often as needed for congestion.  If you develop worsening sinus pain, fever or notice severe headache and vision changes, or if symptoms are not better after completion of antibiotic, please schedule an appointment with a health care provider.   ? ?I do feel your sinus infections may be from uncontrolled seasonal allergies. If not already, I would recommend taking an oral, non-drowsy antihistamine (claritin, zyrtec, allegra, or Xyzal) daily. I will also prescribe Fluticasone nasal spray for you to use 1-2 sprays in each nostril daily for at least 14 days.  ? ?Sinus infections are not as easily transmitted as other respiratory infection, however we still recommend that you avoid close contact with loved ones, especially the very young and elderly.  Remember to wash your hands thoroughly throughout the day as this is the number one way to prevent the spread of infection! ? ?Home Care: ?Only take medications as instructed by your medical team. ?Complete the entire course of an antibiotic. ?Do not take these medications with alcohol. ?A steam or ultrasonic humidifier can help congestion.  You can place a towel over your head and breathe in the steam from hot water coming from a faucet. ?Avoid close contacts especially the very young and the elderly. ?Cover your  mouth when you cough or sneeze. ?Always remember to wash your hands. ? ?Get Help Right Away If: ?You develop worsening fever or sinus pain. ?You develop a severe head ache or visual changes. ?Your symptoms persist after you have completed your treatment plan. ? ?Make sure you ?Understand these instructions. ?Will watch your condition. ?Will get help right away if you are not doing well or get worse. ? ?Thank you for choosing an e-visit. ? ?Your e-visit answers were reviewed by a board certified advanced clinical practitioner to complete your personal care plan. Depending upon the condition, your plan could have included both over the counter or prescription medications. ? ?Please review your pharmacy choice. Make sure the pharmacy is open so you can pick up prescription now. If there is a problem, you may contact your provider through and have the prescription routed to another pharmacy.  Your safety is important to [employer]. If you have drug allergies check your prescription carefully.  ? ?For the next 24 hours you can use MyChart to ask questions about today's visit, request a non-urgent call back, or ask for a work or school excuse. ?You will get an email in the next two days asking about your experience. I hope that your e-visit has been valuable and will speed your recovery. ? ?I provided 5 minutes of non face-to-face time during this encounter for chart review and documentation.  ? ?

## 2022-06-18 ENCOUNTER — Telehealth: Payer: Commercial Managed Care - PPO | Admitting: Physician Assistant

## 2022-06-18 DIAGNOSIS — J019 Acute sinusitis, unspecified: Secondary | ICD-10-CM

## 2022-06-18 DIAGNOSIS — B9689 Other specified bacterial agents as the cause of diseases classified elsewhere: Secondary | ICD-10-CM

## 2022-06-18 MED ORDER — AMOXICILLIN-POT CLAVULANATE 875-125 MG PO TABS: 1.0000 | ORAL_TABLET | Freq: Two times a day (BID) | ORAL | 0 refills | Status: DC

## 2022-06-18 NOTE — Progress Notes (Signed)

## 2022-08-20 ENCOUNTER — Telehealth: Payer: Commercial Managed Care - PPO | Admitting: Physician Assistant

## 2022-08-20 DIAGNOSIS — R3989 Other symptoms and signs involving the genitourinary system: Secondary | ICD-10-CM

## 2022-08-20 MED ORDER — CEPHALEXIN 500 MG PO CAPS: 500.0000 mg | ORAL_CAPSULE | Freq: Two times a day (BID) | ORAL | 0 refills | Status: AC

## 2022-08-20 NOTE — Progress Notes (Signed)
Message sent to patient requesting further input regarding current symptoms. Awaiting patient response.  

## 2022-08-20 NOTE — Progress Notes (Signed)
I have spent 5 minutes in review of e-visit questionnaire, review and updating patient chart, medical decision making and response to patient.   Lasheika Ortloff Cody Tasnia Spegal, PA-C    

## 2022-08-20 NOTE — Progress Notes (Signed)

## 2022-09-19 ENCOUNTER — Telehealth: Payer: Commercial Managed Care - PPO | Admitting: Physician Assistant

## 2022-09-19 DIAGNOSIS — B9689 Other specified bacterial agents as the cause of diseases classified elsewhere: Secondary | ICD-10-CM | POA: Diagnosis not present

## 2022-09-19 DIAGNOSIS — J019 Acute sinusitis, unspecified: Secondary | ICD-10-CM | POA: Diagnosis not present

## 2022-09-19 MED ORDER — AMOXICILLIN-POT CLAVULANATE 875-125 MG PO TABS: 1.0000 | ORAL_TABLET | Freq: Two times a day (BID) | ORAL | 0 refills | Status: DC

## 2022-09-19 NOTE — Progress Notes (Signed)

## 2023-01-29 ENCOUNTER — Encounter: Payer: Self-pay | Admitting: Podiatry

## 2023-01-29 ENCOUNTER — Ambulatory Visit: Payer: Commercial Managed Care - PPO

## 2023-01-29 ENCOUNTER — Ambulatory Visit: Payer: Commercial Managed Care - PPO | Admitting: Podiatry

## 2023-01-29 DIAGNOSIS — M7989 Other specified soft tissue disorders: Secondary | ICD-10-CM | POA: Diagnosis not present

## 2023-01-29 DIAGNOSIS — M792 Neuralgia and neuritis, unspecified: Secondary | ICD-10-CM | POA: Diagnosis not present

## 2023-01-29 DIAGNOSIS — E559 Vitamin D deficiency, unspecified: Secondary | ICD-10-CM | POA: Insufficient documentation

## 2023-01-29 NOTE — Progress Notes (Signed)
Subjective:  Patient ID: Sherri [name], female    DOB: 07/10/1976,  MRN: [id number] HPI Chief Complaint  Patient presents with   Foot Pain    Lateral ankle right - swollen and aching x few years, worsened, did have a fall 2021 that injured her back that she thinks could be causing a difference in the way she walks, radiates into leg and top of foot, no injury to the ankle though, had checked twice - wore air cast, made worse, another doc-gave ankle brace to try, wearing now for 2 weeks and no change, had xrays Feb 2024 at [facility] Patient (Initial Visit)    47 y.o. female presents with the above complaint.   ROS: Denies fever chills nausea vomit muscle aches pains calf pain chest pain shortness of breath.  She has significant back pain and sciatic pain nerve pain along her right side from her previous injury.  Past Medical History:  Diagnosis Date   Abnormal uterine bleeding (AUB)    Chronic back pain    lower back from fall summer [id number]   Complication of anesthesia    slow to awaken in past   Hashimoto's thyroiditis 2020   Hypothyroidism    PONV (postoperative nausea and vomiting)    takes iv nausea med   Past Surgical History:  Procedure Laterality Date   CESAREAN SECTION  2003   x 1   DILITATION & CURRETTAGE/HYSTROSCOPY WITH HYDROTHERMAL ABLATION N/A 09/28/2020   Procedure: DILATATION & CURETTAGE/HYSTEROSCOPY WITH HYDROTHERMAL ABLATION;  Surgeon: Sherri Jude, MD;  Location: [place];  Service: Gynecology;  Laterality: N/A;   TONSILLECTOMY  1990's   TUBAL LIGATION  2003    Current Outpatient Medications:    gabapentin (NEURONTIN) 300 MG capsule, Take 300 mg by mouth 3 (three) times daily., Disp: , Rfl:   Allergies  Allergen Reactions   Codeine     Nauesa, vomit, hives   Erythromycin Hives   Ivp Dye [Iodinated Contrast Media]     Feel like throat closing, itching nausea   Morphine And Related Other (See Comments)    Nausea vomit hives    Venlafaxine Anxiety    Other Reaction(s): worsened anxiety   Review of Systems Objective:  There were no vitals filed for this visit.  General: Well developed, nourished, in no acute distress, alert and oriented x3   Dermatological: Skin is warm, dry and supple bilateral. Nails x 10 are well maintained; remaining integument appears unremarkable at this time. There are no open sores, no preulcerative lesions, no rash or signs of infection present.  Large nonpulsatile mass overlying the sinus tarsi consistent with lipoma or ganglion cyst.  She has tenderness on palpation of the intermediate dorsal cutaneous nerve as it courses down the anterior lateral leg and across the ankle joint.  Vascular: Dorsalis Pedis artery and Posterior Tibial artery pedal pulses are 2/4 bilateral with immedate capillary fill time. Pedal hair growth present. No varicosities and no lower extremity edema present bilateral.   Neruologic: Grossly intact via light touch bilateral. Vibratory intact via tuning fork bilateral. Protective threshold with Semmes Wienstein monofilament intact to all pedal sites bilateral. Patellar and Achilles deep tendon reflexes 2+ bilateral. No Babinski or clonus noted bilateral.   Musculoskeletal: No gross boney pedal deformities bilateral. No pain, crepitus, or limitation noted with foot and ankle range of motion bilateral. Muscular strength 5/5 in all groups tested bilateral.  Gait: Unassisted, Nonantalgic.    Radiographs:  Previous radiographs taken  do not demonstrate any type of osseous abnormalities in this area and all appears to be soft tissue.  Assessment & Plan:   Assessment: Soft tissue tumor of unknown origin possible lipoma versus ganglion cyst versus family history of soft tissue tumor.  This is the right ankle  Plan: Requesting MRI with contrast for soft tissue mass right ankle     Sherri [name], [place]

## 2023-02-21 ENCOUNTER — Ambulatory Visit
Admission: RE | Admit: 2023-02-21 | Discharge: 2023-02-21 | Disposition: A | Discharge status: Home / Self Care | Payer: Commercial Managed Care - PPO | Source: Ambulatory Visit | Attending: Podiatry | Admitting: Podiatry

## 2023-02-21 DIAGNOSIS — M7989 Other specified soft tissue disorders: Secondary | ICD-10-CM

## 2023-02-21 DIAGNOSIS — M792 Neuralgia and neuritis, unspecified: Secondary | ICD-10-CM

## 2023-02-21 MED ORDER — GADOPICLENOL 0.5 MMOL/ML IV SOLN
8.0000 mL | Freq: Once | INTRAVENOUS | Status: AC | PRN
  Administered 2023-02-21: 8 mL via INTRAVENOUS

## 2023-02-27 ENCOUNTER — Ambulatory Visit: Payer: Commercial Managed Care - PPO | Admitting: Podiatry

## 2023-02-27 DIAGNOSIS — M7989 Other specified soft tissue disorders: Secondary | ICD-10-CM | POA: Diagnosis not present

## 2023-02-27 NOTE — Progress Notes (Signed)
She presents today for follow-up of her MRI right ankle.  She states that since the injury that she sustained while working at [employer] back in 2021 she states that her entire right side has been painful.  She relates that she had discussed her foot pain with her orthopedist at her initial evaluation and during subsequent follow-ups with them.  However they had never obtained an MRI.  Objective: Vital signs are stable she is alert and oriented x 3 pulses are palpable ankle is swollen moderate to severe tenderness on palpation of the peroneus brevis against resistance soft tissue mass overlying the sinus tarsi secondary to swelling most likely.  MRI did demonstrate a 15 cm tear of the peroneus brevis tendon.  A soft tissue mass overlying the sinus tarsi possible lipoma..  Assessment: Significant finding of a peroneus brevis tear right.  Plan: Discussed etiology pathology conservative surgical therapies consented her for primary repair of the peroneus brevis tendon right with excision soft tissue mass right sinus tarsi.  PRP injection and cast application.  This will be performed on an outpatient basis to [facility] specialty surgical center with IV sedation and nerve block to the right lower extremity.  We did discuss the pros and cons of the surgery and the repair and the length of time that we will take for rehabilitation to occur.  She understands the possible side effects which may include but are not limited to postop pain bleeding swell infection recurrence need for further surgery overcorrection under correction also digit loss limb loss of life.  I will follow-up with her in July for surgical repair of this tendon.  Until then she should wear her brace and continue anti-inflammatories as needed.

## 2023-03-08 ENCOUNTER — Telehealth: Payer: Commercial Managed Care - PPO | Admitting: Physician Assistant

## 2023-03-08 DIAGNOSIS — B9689 Other specified bacterial agents as the cause of diseases classified elsewhere: Secondary | ICD-10-CM | POA: Diagnosis not present

## 2023-03-08 DIAGNOSIS — J019 Acute sinusitis, unspecified: Secondary | ICD-10-CM | POA: Diagnosis not present

## 2023-03-08 MED ORDER — AMOXICILLIN-POT CLAVULANATE 875-125 MG PO TABS: 1.0000 | ORAL_TABLET | Freq: Two times a day (BID) | ORAL | 0 refills | Status: DC

## 2023-03-08 NOTE — Progress Notes (Signed)

## 2023-05-01 ENCOUNTER — Telehealth: Payer: Self-pay | Admitting: Urology

## 2023-05-01 NOTE — Telephone Encounter (Signed)
DOS - 05/31/23  EXCISION TUMOR ANKLE RT - 28090 REPAIR PERONEUS BREVIS TENDON 16109  [facility] - EFFECTIVE DATE 11/12/22  DEDUCTIBLE - $1,600.00 W/ $1,600.00 REMAINING OOP -  $3,800.00 W/ $3,598.78 REMAINING COINSURANCE  - 20%  SPOKE WITH ELLA WITH [facility] AND SHE STATED THAT FOR CPT CODES 28090 AND 60454 NO PRIOR AUTH IS REQUIRED.  CALL REF # [id number]

## 2023-05-03 DIAGNOSIS — M79676 Pain in unspecified toe(s): Secondary | ICD-10-CM

## 2023-05-29 ENCOUNTER — Other Ambulatory Visit: Payer: Self-pay | Admitting: Podiatry

## 2023-05-29 MED ORDER — ONDANSETRON HCL 4 MG PO TABS: 4.0000 mg | ORAL_TABLET | Freq: Three times a day (TID) | ORAL | 0 refills | Status: DC | PRN

## 2023-05-29 MED ORDER — CEPHALEXIN 500 MG PO CAPS: 500.0000 mg | ORAL_CAPSULE | Freq: Three times a day (TID) | ORAL | 0 refills | Status: DC

## 2023-05-29 MED ORDER — IBUPROFEN 600 MG PO TABS: 600.0000 mg | ORAL_TABLET | Freq: Three times a day (TID) | ORAL | 0 refills | Status: DC | PRN

## 2023-05-31 DIAGNOSIS — M7989 Other specified soft tissue disorders: Secondary | ICD-10-CM | POA: Diagnosis not present

## 2023-06-05 ENCOUNTER — Ambulatory Visit (INDEPENDENT_AMBULATORY_CARE_PROVIDER_SITE_OTHER): Payer: Commercial Managed Care - PPO | Admitting: Podiatry

## 2023-06-05 VITALS — BP 139/89 | HR 81 | Temp 97.0°F | Resp 18

## 2023-06-05 DIAGNOSIS — M7989 Other specified soft tissue disorders: Secondary | ICD-10-CM

## 2023-06-05 NOTE — Progress Notes (Signed)
She presents today for her first postop visit she is status post primary repair of peroneus brevis tear with excision of soft tissue mass of the ankle PRP injection and cast application.  She denies fever chills nausea or muscle aches and pains states that the lateral aspect of the ankle is a little bit tender when she lays her foot down on it.  She has good motion in her toes and no other problems presents today with her knee scooter.  Objective: Vital signs are stable alert and oriented x 3.  Cast is intact toes are visible she has good range of motion of the toes color to his toes are good she has good sensation of all of her toes 1 through 5 cast is loose proximally but snug around the ankle.  Assessment: Well-healing surgical foot first postop visit date of surgery 05/31/2019 for right foot.  Plan: Discussed etiology pathology and surgical therapies at this point we will remove her cast in 1 week she is to continue to be nonweightbearing we will plan another cast at that time.

## 2023-06-06 ENCOUNTER — Ambulatory Visit (INDEPENDENT_AMBULATORY_CARE_PROVIDER_SITE_OTHER): Payer: Commercial Managed Care - PPO | Admitting: Podiatry

## 2023-06-06 DIAGNOSIS — M7989 Other specified soft tissue disorders: Secondary | ICD-10-CM

## 2023-06-06 DIAGNOSIS — G8918 Other acute postprocedural pain: Secondary | ICD-10-CM | POA: Diagnosis not present

## 2023-06-06 DIAGNOSIS — R600 Localized edema: Secondary | ICD-10-CM

## 2023-06-06 NOTE — Progress Notes (Signed)
   HPI: 47 y.o. female presents today noting that she was just seen by Dr. Al Corpus yesterday and had a cast change at that time.  She states that overnight last night she had excruciating pain around the ankle area and it felt like the cast was jabbing into her ankle.  She feels like it is cutting her.  The medical assistant/nurse removed the cast today.  Past Medical History:  Diagnosis Date   Abnormal uterine bleeding (AUB)    Chronic back pain    lower back from fall summer 2021   Complication of anesthesia    slow to awaken in past   Hashimoto's thyroiditis 2020   Hypothyroidism    PONV (postoperative nausea and vomiting)    takes iv nausea med    Past Surgical History:  Procedure Laterality Date   CESAREAN SECTION  2003   x 1   DILITATION & CURRETTAGE/HYSTROSCOPY WITH HYDROTHERMAL ABLATION N/A 09/28/2020   Procedure: DILATATION & CURETTAGE/HYSTEROSCOPY WITH HYDROTHERMAL ABLATION;  Surgeon: Reva Bores, MD;  Location: [facility];  Service: Gynecology;  Laterality: N/A;   TONSILLECTOMY  1990's   TUBAL LIGATION  2003    Allergies  Allergen Reactions   Codeine     Nauesa, vomit, hives   Erythromycin Hives   Ivp Dye [Iodinated Contrast Media]     Feel like throat closing, itching nausea   Morphine And Codeine Other (See Comments)    Nausea vomit hives   Venlafaxine Anxiety    Other Reaction(s): worsened anxiety    Physical Exam: There were no vitals filed for this visit.  There are palpable pulses.  There is moderate edema to the lateral aspect of the right ankle and hindfoot.  Incision is well-approximated.  No dehiscence is noted.  No clinical signs of infection are seen.  There are no areas of abrasion to the skin from the cast.  Capillary refill is immediate to all toes on the right foot.  No pain with compression of the calf  Assessment/Plan of Care: 1. Post-op pain   2. Mass of soft tissue of foot   3. Localized edema    Discussed clinical  findings with patient today.  A new, below-knee, fiberglass cast was applied today.  This included applying a dressing to the surgical area, which consisted of Xeroform gauze, sterile 3x3's, and sterile Kerlix.  The Ace wrap that was on her foot under the cast was not reapplied as this may have been what was causing so much discomfort.  Cast padding was then applied after the stockinette was donned over the leg and foot.  3 rolls of fiberglass cast tape were applied from the toes to just below the knee.  Patient made sure to verbalize that she did not want this cast on too tight today.  She will continue with her current postoperative instructions and use her knee scooter for ambulation.  Follow-up as scheduled with Dr. Alma Downs, DPM, FACFAS [facility]     [address] [place], [place] [address]                Office [phone]  Fax [phone]

## 2023-06-12 ENCOUNTER — Ambulatory Visit (INDEPENDENT_AMBULATORY_CARE_PROVIDER_SITE_OTHER): Payer: Commercial Managed Care - PPO | Admitting: Podiatry

## 2023-06-12 ENCOUNTER — Encounter: Payer: Self-pay | Admitting: Podiatry

## 2023-06-12 DIAGNOSIS — M7989 Other specified soft tissue disorders: Secondary | ICD-10-CM

## 2023-06-12 DIAGNOSIS — Z9889 Other specified postprocedural states: Secondary | ICD-10-CM

## 2023-06-12 DIAGNOSIS — S86311D Strain of muscle(s) and tendon(s) of peroneal muscle group at lower leg level, right leg, subsequent encounter: Secondary | ICD-10-CM

## 2023-06-12 NOTE — Progress Notes (Signed)
She presents today for a postop visit s/p repair peroneal tendon tear.  She denies fever chills nausea vomit muscle aches and pains.  Date of surgery was 05/31/2023.  Objective: Presents today knee scooter cast intact.  Cast was removed dressed her dressing intact once removed demonstrates no erythema some mild edema no cellulitis drainage or odor though she does have a small blister just above the staples at the site of removal of her soft tissue mass.  Assessment: Well-healing surgical foot.  Plan: Redressed today dressed a compressive dressing cast application follow-up with her in 2 weeks for cast removal hopefully be able t to remove staples at that time.

## 2023-06-13 ENCOUNTER — Encounter: Payer: Self-pay | Admitting: Podiatry

## 2023-06-14 ENCOUNTER — Ambulatory Visit (INDEPENDENT_AMBULATORY_CARE_PROVIDER_SITE_OTHER): Payer: Commercial Managed Care - PPO | Admitting: Podiatry

## 2023-06-14 DIAGNOSIS — M7989 Other specified soft tissue disorders: Secondary | ICD-10-CM

## 2023-06-14 DIAGNOSIS — Z9889 Other specified postprocedural states: Secondary | ICD-10-CM | POA: Diagnosis not present

## 2023-06-14 NOTE — Progress Notes (Signed)
She presents today for a postop visit s/p repair peroneal tendon tear.  She denies fever chills nausea vomit muscle aches and pains.  Date of surgery was 05/31/2023.  Objective: Presents today knee scooter cast intact.  Cast was removed dressed her dressing intact once removed demonstrates no erythema some mild edema no cellulitis drainage or odor though no blistering noted.  Assessment: Well-healing surgical foot.  Plan: Incision was redressed appears to be well coapted.  Cast was changed.  No acute complaints.

## 2023-06-17 ENCOUNTER — Telehealth: Payer: Commercial Managed Care - PPO | Admitting: Nurse Practitioner

## 2023-06-17 DIAGNOSIS — J0140 Acute pansinusitis, unspecified: Secondary | ICD-10-CM | POA: Diagnosis not present

## 2023-06-17 MED ORDER — FLUTICASONE PROPIONATE 50 MCG/ACT NA SUSP: 2.0000 | Freq: Every day | NASAL | 6 refills | Status: DC

## 2023-06-17 MED ORDER — AMOXICILLIN-POT CLAVULANATE 875-125 MG PO TABS: 1.0000 | ORAL_TABLET | Freq: Two times a day (BID) | ORAL | 0 refills | Status: AC

## 2023-06-17 NOTE — Progress Notes (Signed)
E-Visit for Sinus Problems  We are sorry that you are not feeling well.  Here is how we plan to help!  Based on what you have shared with me it looks like you have sinusitis.  Sinusitis is inflammation and infection in the sinus cavities of the head.  Based on your presentation I believe you most likely have Acute Bacterial Sinusitis.  This is an infection caused by bacteria and is treated with antibiotics. I have prescribed Augmentin 875mg /125mg  one tablet twice daily with food, for 7 days.we will also recommend using Flonase nasal spray daily.    You may use an oral decongestant such as Mucinex D or if you have glaucoma or high blood pressure use plain Mucinex. Saline nasal spray help and can safely be used as often as needed for congestion.  If you develop worsening sinus pain, fever or notice severe headache and vision changes, or if symptoms are not better after completion of antibiotic, please schedule an appointment with a health care provider.    Sinus infections are not as easily transmitted as other respiratory infection, however we still recommend that you avoid close contact with loved ones, especially the very young and elderly.  Remember to wash your hands thoroughly throughout the day as this is the number one way to prevent the spread of infection!  Home Care: Only take medications as instructed by your medical team. Complete the entire course of an antibiotic. Do not take these medications with alcohol. A steam or ultrasonic humidifier can help congestion.  You can place a towel over your head and breathe in the steam from hot water coming from a faucet. Avoid close contacts especially the very young and the elderly. Cover your mouth when you cough or sneeze. Always remember to wash your hands.  Get Help Right Away If: You develop worsening fever or sinus pain. You develop a severe head ache or visual changes. Your symptoms persist after you have completed your treatment  plan.  Make sure you Understand these instructions. Will watch your condition. Will get help right away if you are not doing well or get worse.  Thank you for choosing an e-visit.  Your e-visit answers were reviewed by a board certified advanced clinical practitioner to complete your personal care plan. Depending upon the condition, your plan could have included both over the counter or prescription medications.  Please review your pharmacy choice. Make sure the pharmacy is open so you can pick up prescription now. If there is a problem, you may contact your provider through [employer] and have the prescription routed to another pharmacy.  Your safety is important to [place]. If you have drug allergies check your prescription carefully.   For the next 24 hours you can use MyChart to ask questions about today's visit, request a non-urgent call back, or ask for a work or school excuse. You will get an email in the next two days asking about your experience. I hope that your e-visit has been valuable and will speed your recovery.   Meds ordered this encounter  Medications   amoxicillin-clavulanate (AUGMENTIN) 875-125 MG tablet    Sig: Take 1 tablet by mouth 2 (two) times daily for 7 days. Take with food    Dispense:  14 tablet    Refill:  0   fluticasone (FLONASE) 50 MCG/ACT nasal spray    Sig: Place 2 sprays into both nostrils daily.    Dispense:  16 g    Refill:  6  I spent approximately 5 minutes reviewing the patient's history, current symptoms and coordinating their care today.

## 2023-06-18 ENCOUNTER — Telehealth: Payer: Self-pay | Admitting: Podiatry

## 2023-06-18 NOTE — Telephone Encounter (Signed)
Pt called back and I went and talked to Dr Al Corpus about it.  He said she could come tomorrow in [place] or Thursday in [place]. He also said to tell her to elevate her foot.  She said that could cause it to hurt due to the swelling and I explained that yes that could cause pain. She is scheduled tomorrow 8.7 in [place] office.

## 2023-06-18 NOTE — Telephone Encounter (Signed)
Pt called and had surgery and has a cast on and it feels like there is something in the cast poking her causing pain. IS this normal? Should she come in this week to get cast off to check. She has an ppt next week already.

## 2023-06-19 ENCOUNTER — Ambulatory Visit (INDEPENDENT_AMBULATORY_CARE_PROVIDER_SITE_OTHER): Payer: Commercial Managed Care - PPO | Admitting: Podiatry

## 2023-06-19 ENCOUNTER — Encounter: Payer: Self-pay | Admitting: Podiatry

## 2023-06-19 DIAGNOSIS — M7989 Other specified soft tissue disorders: Secondary | ICD-10-CM

## 2023-06-19 DIAGNOSIS — S86311D Strain of muscle(s) and tendon(s) of peroneal muscle group at lower leg level, right leg, subsequent encounter: Secondary | ICD-10-CM

## 2023-06-19 DIAGNOSIS — Z9889 Other specified postprocedural states: Secondary | ICD-10-CM

## 2023-06-19 NOTE — Progress Notes (Signed)
She presents today date of surgery 05/31/2023 primary repair of peroneus brevis with cast and excision of soft tissue mass right ankle.  She states that she got her cast wet in the shower and tried to use a hair dryer to dry it she also states that she feels like she has something sticking in the lateral aspect of her foot I do not know if it is a staple or not but it seems to be pulling on the stitches.  She states that the cast feels loose and it feels like it is pistoning on her leg.  Objective: Vital signs are stable she is alert oriented x 3 presents with her knee scooter.  Plantar aspect of the cast appears to be dry and clean.  Cast was removed dressed sterile dressing was intact and did not appear to be wet.  Staples are intact margins appear to be coapting nicely.  Assessment well-healing surgical foot right.  Plan: Encouraged another cast for at least 2 weeks after a dry sterile compressive dressing.  She will continue nonweightbearing.  Follow-up with me in 2 weeks if possible for cast removal staple removal and she will go into a cam boot with partial weightbearing.

## 2023-06-26 ENCOUNTER — Encounter: Payer: Commercial Managed Care - PPO | Admitting: Podiatry

## 2023-07-03 ENCOUNTER — Ambulatory Visit (INDEPENDENT_AMBULATORY_CARE_PROVIDER_SITE_OTHER): Payer: Commercial Managed Care - PPO | Admitting: Podiatry

## 2023-07-03 ENCOUNTER — Encounter: Payer: Self-pay | Admitting: Podiatry

## 2023-07-03 DIAGNOSIS — M7989 Other specified soft tissue disorders: Secondary | ICD-10-CM

## 2023-07-03 DIAGNOSIS — Z9889 Other specified postprocedural states: Secondary | ICD-10-CM

## 2023-07-03 DIAGNOSIS — S86311D Strain of muscle(s) and tendon(s) of peroneal muscle group at lower leg level, right leg, subsequent encounter: Secondary | ICD-10-CM

## 2023-07-03 NOTE — Progress Notes (Signed)
She presents today for postop visit date of surgery 05/31/2019 for PRP injection with a primary peroneal tendon repair.  She states that she is having pain that is rating her pain radiating across the top of the foot as well as along the incision.  Denies fever chills nausea vomit muscle aches and pains.  States that she has been up and about more than she should be on her foot.  Objective: Cast was intact once removed demonstrates staples are intact margins for the most part are well coapted with exception of 1 small area in the central aspect of the incision but there is no purulence no malodor there is some scabbing that is present.  She has tenderness on palpation but attempted range of motion she does pretty well.  Assessment well-healing surgical ankle and foot.  Plan: At this point I recommended removing the staples placing Steri-Strips and putting her back in a light dressing for another week we will put her in a cam boot she will remain nonweightbearing follow-up with her in 1 week for evaluation of the wound.

## 2023-07-10 ENCOUNTER — Encounter: Payer: Self-pay | Admitting: Podiatry

## 2023-07-10 ENCOUNTER — Ambulatory Visit (INDEPENDENT_AMBULATORY_CARE_PROVIDER_SITE_OTHER): Payer: Commercial Managed Care - PPO | Admitting: Podiatry

## 2023-07-10 DIAGNOSIS — Z9889 Other specified postprocedural states: Secondary | ICD-10-CM

## 2023-07-10 DIAGNOSIS — M7989 Other specified soft tissue disorders: Secondary | ICD-10-CM

## 2023-07-10 DIAGNOSIS — S86311D Strain of muscle(s) and tendon(s) of peroneal muscle group at lower leg level, right leg, subsequent encounter: Secondary | ICD-10-CM

## 2023-07-10 NOTE — Progress Notes (Signed)
Gricel's date of surgery was 05/31/2023 for her right foot primary repair of peroneus brevis tendon tear.  She states that the foot is still incredibly swollen and exquisitely painful.  States that she can only wear the boot for a while because if her leg swells and starts to rub the side of the boot and makes foot hurt.  She states that she still been using her scooter staying off the foot is much as possible.  Objective: Dry sterile dressing intact once removed demonstrates mild dehiscence of the surgical wound but no deep structures are identified.  Steri-Strips were removed today as well as debridement of the eschar was performed.  She has good range of motion though it is moderately tender.  She has allodynic type pain with stroking of the skin and sharp shooting pains on palpation.  Assessment: Slowly healing lateral incision status post peroneus brevis tendon repair.  Plan: Discussed etiology pathology conservative surgical therapies at this point I feel it is necessary for her to start bearing weight on this wound.  She is going to have to perform daily wound care which she and I discussed in detail today.  She is alert also has start some at home physical therapy.  But I do expect this wound to take at least a month to heal and then anywhere from 2 to 4 weeks to rehab this foot.  We are not able to rehab this foot in total until this wound is healed.  She will most likely need to be out of work for a month past her scheduled go back to date which I believe is the first part of September

## 2023-07-12 ENCOUNTER — Telehealth: Payer: Commercial Managed Care - PPO | Admitting: Family Medicine

## 2023-07-12 DIAGNOSIS — B9689 Other specified bacterial agents as the cause of diseases classified elsewhere: Secondary | ICD-10-CM | POA: Diagnosis not present

## 2023-07-12 DIAGNOSIS — J019 Acute sinusitis, unspecified: Secondary | ICD-10-CM | POA: Diagnosis not present

## 2023-07-12 MED ORDER — AMOXICILLIN-POT CLAVULANATE 875-125 MG PO TABS: 1.0000 | ORAL_TABLET | Freq: Two times a day (BID) | ORAL | 0 refills | Status: DC

## 2023-07-12 NOTE — Progress Notes (Signed)
E-Visit for Sinus Problems  We are sorry that you are not feeling well.  Here is how we plan to help!  Based on what you have shared with me it looks like you have sinusitis.  Sinusitis is inflammation and infection in the sinus cavities of the head.  Based on your presentation I believe you most likely have Acute Bacterial Sinusitis.  This is an infection caused by bacteria and is treated with antibiotics. I have prescribed Augmentin 875mg/125mg one tablet twice daily with food, for 7 days. You may use an oral decongestant such as Mucinex D or if you have glaucoma or high blood pressure use plain Mucinex. Saline nasal spray help and can safely be used as often as needed for congestion.  If you develop worsening sinus pain, fever or notice severe headache and vision changes, or if symptoms are not better after completion of antibiotic, please schedule an appointment with a health care provider.    Sinus infections are not as easily transmitted as other respiratory infection, however we still recommend that you avoid close contact with loved ones, especially the very young and elderly.  Remember to wash your hands thoroughly throughout the day as this is the number one way to prevent the spread of infection!  Home Care: Only take medications as instructed by your medical team. Complete the entire course of an antibiotic. Do not take these medications with alcohol. A steam or ultrasonic humidifier can help congestion.  You can place a towel over your head and breathe in the steam from hot water coming from a faucet. Avoid close contacts especially the very young and the elderly. Cover your mouth when you cough or sneeze. Always remember to wash your hands.  Get Help Right Away If: You develop worsening fever or sinus pain. You develop a severe head ache or visual changes. Your symptoms persist after you have completed your treatment plan.  Make sure you Understand these instructions. Will watch  your condition. Will get help right away if you are not doing well or get worse.  Thank you for choosing an e-visit.  Your e-visit answers were reviewed by a board certified advanced clinical practitioner to complete your personal care plan. Depending upon the condition, your plan could have included both over the counter or prescription medications.  Please review your pharmacy choice. Make sure the pharmacy is open so you can pick up prescription now. If there is a problem, you may contact your provider through MyChart messaging and have the prescription routed to another pharmacy.  Your safety is important to us. If you have drug allergies check your prescription carefully.   For the next 24 hours you can use MyChart to ask questions about today's visit, request a non-urgent call back, or ask for a work or school excuse. You will get an email in the next two days asking about your experience. I hope that your e-visit has been valuable and will speed your recovery.    have provided 5 minutes of non face to face time during this encounter for chart review and documentation.   

## 2023-07-24 ENCOUNTER — Encounter: Payer: Self-pay | Admitting: Podiatry

## 2023-07-24 ENCOUNTER — Ambulatory Visit (INDEPENDENT_AMBULATORY_CARE_PROVIDER_SITE_OTHER): Payer: Commercial Managed Care - PPO | Admitting: Podiatry

## 2023-07-24 DIAGNOSIS — S86311D Strain of muscle(s) and tendon(s) of peroneal muscle group at lower leg level, right leg, subsequent encounter: Secondary | ICD-10-CM

## 2023-07-24 DIAGNOSIS — M7989 Other specified soft tissue disorders: Secondary | ICD-10-CM

## 2023-07-24 DIAGNOSIS — Z9889 Other specified postprocedural states: Secondary | ICD-10-CM

## 2023-07-24 NOTE — Progress Notes (Signed)
She presents today for her postop visit date of surgery 05/31/2023.  She is approximately 2 months out from a repair of peroneus brevis tendon tear and excision of soft tissue mass from the ankle.  She also had a application of cast.  She states that she is doing okay but she still feeling pain at the surgical site.  And she thinks she has a wound that is not close completely.  Objective: Vital signs stable alert oriented x 3 she presents nonweightbearing today with her knee scooter.  Cam boot was intact once removed demonstrates moderate edema to the right ankle the area that she thought was open is actually scabbed over and I remove the scab.  It looks pretty good at this point does not appear to be open draining.  She has good range of motion against resistance however she has allodynic type symptomatology most likely because of the entrapment of the intermediate and lateral cutaneous nerves dorsally.  Assessment: Well-healing surgical ankle with scar tissue.  Plan: I highly recommended massage therapy and walking.  I want to get off of the knee scooter and start walking in the boot.  She seems to be reluctant to do so.  I did refer her to [facility] therapy for mobilization and strengthening and desensitization.  I will follow-up with her in 2 to 3 weeks.

## 2023-08-14 ENCOUNTER — Encounter: Payer: Self-pay | Admitting: Podiatry

## 2023-08-14 ENCOUNTER — Ambulatory Visit (INDEPENDENT_AMBULATORY_CARE_PROVIDER_SITE_OTHER): Payer: Commercial Managed Care - PPO | Admitting: Podiatry

## 2023-08-14 DIAGNOSIS — S86311D Strain of muscle(s) and tendon(s) of peroneal muscle group at lower leg level, right leg, subsequent encounter: Secondary | ICD-10-CM

## 2023-08-14 DIAGNOSIS — M7989 Other specified soft tissue disorders: Secondary | ICD-10-CM

## 2023-08-14 DIAGNOSIS — Z9889 Other specified postprocedural states: Secondary | ICD-10-CM

## 2023-08-14 NOTE — Progress Notes (Signed)
She presents today date of surgery 05/31/2019 for primary repair of peroneus brevis tendon tear with excision of soft tissue mass of the ankle.  She states that is doing a lot better.  She states that feel it is getting better and better every week as she still seeing Thayer Ohm at physical therapy.  She continues to wear the cam boot and has walked minimally without it.  Objective: Vital signs are stable alert oriented x 3.  Pulses are palpable.  There is no erythema edema cellulitis drainage or odor incision site is going on to heal very nicely though there is still some scarring at the distalmost aspect of the incision where there is some tenderness.  She has good range of motion dorsiflexion plantarflexion inversion and eversion.  Assessment: Well-healing surgical foot and ankle right.  Plan: At this point I think she can probably go back to work provide she can wear her boot.  I would like for her to try to start to transition to tennis shoes provided she does not have a lot of swelling.  I will follow-up with her in about 3 weeks for reevaluation may need to consider bracing her at that point.

## 2023-08-20 ENCOUNTER — Telehealth: Payer: Commercial Managed Care - PPO | Admitting: Physician Assistant

## 2023-08-20 DIAGNOSIS — B9689 Other specified bacterial agents as the cause of diseases classified elsewhere: Secondary | ICD-10-CM | POA: Diagnosis not present

## 2023-08-20 DIAGNOSIS — J019 Acute sinusitis, unspecified: Secondary | ICD-10-CM

## 2023-08-20 MED ORDER — AMOXICILLIN-POT CLAVULANATE 875-125 MG PO TABS: 1.0000 | ORAL_TABLET | Freq: Two times a day (BID) | ORAL | 0 refills | Status: DC

## 2023-08-20 NOTE — Progress Notes (Signed)

## 2023-09-11 ENCOUNTER — Ambulatory Visit (INDEPENDENT_AMBULATORY_CARE_PROVIDER_SITE_OTHER): Payer: Commercial Managed Care - PPO | Admitting: Podiatry

## 2023-09-11 ENCOUNTER — Encounter: Payer: Self-pay | Admitting: Podiatry

## 2023-09-11 DIAGNOSIS — S86311D Strain of muscle(s) and tendon(s) of peroneal muscle group at lower leg level, right leg, subsequent encounter: Secondary | ICD-10-CM

## 2023-09-11 DIAGNOSIS — M7989 Other specified soft tissue disorders: Secondary | ICD-10-CM | POA: Diagnosis not present

## 2023-09-11 DIAGNOSIS — Z9889 Other specified postprocedural states: Secondary | ICD-10-CM

## 2023-09-11 NOTE — Progress Notes (Signed)
She presents today date of surgery 05/31/2019 for excision soft tissue mass of the ankle and peroneus brevis tendon repair.  Currently she states that she feels like she is doing much better and continues to go to physical therapy on a regular basis.  She states that the foot is still painful so she continues to wear the boot but it has progressed considerably.  Objective: Vital signs are stable she is alert oriented x 3.  With abduction against resistance she has easily palpable peroneus brevis tendon with adhesion of the tendon sheath.  No open lesions or wounds.  Assessment: Well-healing surgical foot delayed to chronic pain.  Plan: Due to the chronic pain I think physical therapy and manipulation should be continued I am highly recommend that she get into a Tri-Lock brace which she thinks she may have at home and possibly even walking barefoot to some degree while in the house.  I would like for her to try working with the Tri-Lock brace initially at home utilizing a tennis shoe with that and then progressing to just a tennis shoe.  I will follow-up with her in 1 month for reevaluation

## 2023-09-26 ENCOUNTER — Telehealth: Payer: Commercial Managed Care - PPO | Admitting: Physician Assistant

## 2023-09-26 DIAGNOSIS — J329 Chronic sinusitis, unspecified: Secondary | ICD-10-CM

## 2023-09-26 NOTE — Progress Notes (Signed)
Because of recurrent sinus infections, I feel your condition warrants further evaluation and I recommend that you be seen in a face to face visit.   NOTE: There will be NO CHARGE for this eVisit   If you are having a true medical emergency please call 911.      For an urgent face to face visit, [facility] has eight urgent care centers for your convenience:   NEW!! [facility] at [facility] Get Driving Directions [phone] [address] [place], [address]    [facility] at [facility] Get Driving Directions [phone] [address] [place], [place] [address]   [facility] Center [facility]) Get Driving Directions [phone] [address] [place], [place] [address]  [facility] Center [facility]) Get Driving Directions [phone] [address] [place],  [place]  [address]  [facility] Center [facility] at [employer]  [phone] [address] [place],  [place] [address]   [facility] Health Urgent Care at [facility] Get Driving Directions [phone] [address] [place], [place] [address]   [facility] Health Urgent Care at [facility] Get Driving Directions  [phone] [address] [place], [place] [address]   [facility] Health Urgent Care at [facility] Directions [phone] [address], [address] [place], [place] [address]  Your MyChart E-visit questionnaire answers were reviewed by a board certified advanced clinical practitioner to complete your personal care plan based on your specific symptoms.  Thank you for using e-Visits.    I have spent 5 minutes in review of e-visit questionnaire, review and updating patient chart, medical decision making and response to patient.   Margaretann Loveless, PA-C

## 2023-10-02 ENCOUNTER — Encounter: Payer: Self-pay | Admitting: Podiatry

## 2023-10-02 ENCOUNTER — Ambulatory Visit (INDEPENDENT_AMBULATORY_CARE_PROVIDER_SITE_OTHER): Payer: Commercial Managed Care - PPO | Admitting: Podiatry

## 2023-10-02 DIAGNOSIS — S86311D Strain of muscle(s) and tendon(s) of peroneal muscle group at lower leg level, right leg, subsequent encounter: Secondary | ICD-10-CM

## 2023-10-02 DIAGNOSIS — Z9889 Other specified postprocedural states: Secondary | ICD-10-CM

## 2023-10-02 DIAGNOSIS — M7989 Other specified soft tissue disorders: Secondary | ICD-10-CM

## 2023-10-02 NOTE — Progress Notes (Signed)
She presents today date of surgery 05/31/2023.  Peroneal tendon repair right ankle and excision of soft tissue mass.  She states that is better than it was still seeing physical therapy but missed the last few therapies because my husband was in the hospital.  Still hurts right here and along the fifth metatarsal base as she points to it.  Objective: Vital signs are stable alert oriented x 3.  The peroneus brevis tendon repair appears to be healing very well.  She has some scar tissue that is attached from the skin to the tendon and with use of the tendon is actually pulling on the skin.  This is the same tendon that attaches to the base of the fifth metatarsal where it hurts at its insertion site.  There does not appear to be a fracture of the fifth met base.  Assessment: I encouraged her to follow-up with physical therapy so he can breakdown this 1 small section of scar tissue.  Otherwise the wound is healing very nicely and the tendon is healing well.  Plan: Continue physical therapy follow-up with her in 6 weeks

## 2023-11-04 ENCOUNTER — Telehealth: Payer: Commercial Managed Care - PPO | Admitting: Family Medicine

## 2023-11-04 DIAGNOSIS — J329 Chronic sinusitis, unspecified: Secondary | ICD-10-CM

## 2023-11-04 NOTE — Progress Notes (Signed)
Because of recurrent sinus infections, I feel your condition warrants further evaluation and I recommend that you be seen in a face to face visit.   NOTE: There will be NO CHARGE for this eVisit   If you are having a true medical emergency please call 911.

## 2023-11-20 ENCOUNTER — Ambulatory Visit
Admission: RE | Admit: 2023-11-20 | Discharge: 2023-11-20 | Disposition: A | Discharge status: Home / Self Care | Payer: Commercial Managed Care - PPO | Source: Ambulatory Visit | Attending: Family Medicine | Admitting: Family Medicine

## 2023-11-20 VITALS — BP 126/81 | HR 84 | Temp 98.6°F | Resp 19

## 2023-11-20 DIAGNOSIS — J329 Chronic sinusitis, unspecified: Secondary | ICD-10-CM

## 2023-11-20 MED ORDER — FLUCONAZOLE 150 MG PO TABS: 150.0000 mg | ORAL_TABLET | Freq: Once | ORAL | 0 refills | Status: AC

## 2023-11-20 MED ORDER — AMOXICILLIN-POT CLAVULANATE 875-125 MG PO TABS: 1.0000 | ORAL_TABLET | Freq: Two times a day (BID) | ORAL | 0 refills | Status: DC

## 2023-11-20 NOTE — ED Provider Notes (Signed)
 [facility]    CSN: [id number] Arrival date & time: 11/20/23  1458      History   Chief Complaint Chief Complaint  Patient presents with   Cough    Sinus draining and coughing - Entered by patient   Headache    HPI Sherri [name] is a 48 y.o. female.   HPI  History obtained from the patient. Sherri [name] presents for nasal congestion for the past 3 days.  She has headache, chest congestion, productive cough.  No fever, vomiting or diarrhea.  She doesn't have ENT doctor anymore as hers left the practice.  Has history of recurrent sinusitis.  No asthma. Denies smoking and vaping.      Past Medical History:  Diagnosis Date   Abnormal uterine bleeding (AUB)    Chronic back pain    lower back from fall summer 2021   Complication of anesthesia    slow to awaken in past   Hashimoto's thyroiditis 2020   Hypothyroidism    PONV (postoperative nausea and vomiting)    takes iv nausea med    Patient Active Problem List   Diagnosis Date Noted   Vitamin D deficiency 01/29/2023   Elevated blood pressure reading 08/04/2021   Ulnar neuropathy of right upper extremity 03/06/2021   Hypothyroid 09/16/2020   Menorrhagia 03/22/2020    Past Surgical History:  Procedure Laterality Date   CESAREAN SECTION  2003   x 1   DILITATION & CURRETTAGE/HYSTROSCOPY WITH HYDROTHERMAL ABLATION N/A 09/28/2020   Procedure: DILATATION & CURETTAGE/HYSTEROSCOPY WITH HYDROTHERMAL ABLATION;  Surgeon: Fredirick Glenys RAMAN, MD;  Location: [facility];  Service: Gynecology;  Laterality: N/A;   TONSILLECTOMY  1990's   TUBAL LIGATION  2003    OB History   No obstetric history on file.      Home Medications    Prior to Admission medications   Medication Sig Start Date End Date Taking? Authorizing Provider  amoxicillin -clavulanate (AUGMENTIN ) 875-125 MG tablet Take 1 tablet by mouth every 12 (twelve) hours. 11/20/23  Yes Braxton Vantrease, DO  fluconazole  (DIFLUCAN ) 150 MG tablet Take  1 tablet (150 mg total) by mouth once for 1 dose. 11/20/23 11/20/23 Yes Quinisha Mould, DO  diclofenac (VOLTAREN) 75 MG EC tablet Take 75 mg by mouth 2 (two) times daily as needed. 08/18/23   [provider]    Family History Family History  Problem Relation Age of Onset   Healthy Mother    Bladder Cancer Father    Lung cancer Father    Mitral valve prolapse Father    Thyroid  disease Neg Hx     Social History Social History   Tobacco Use   Smoking status: Never   Smokeless tobacco: Never  Vaping Use   Vaping status: Never Used  Substance Use Topics   Alcohol use: No   Drug use: No     Allergies   Codeine, Erythromycin, Ivp dye [iodinated contrast media], Morphine  and codeine, and Venlafaxine   Review of Systems Review of Systems: negative unless otherwise stated in HPI.      Physical Exam Triage Vital Signs ED Triage Vitals  Encounter Vitals Group     BP      Systolic BP Percentile      Diastolic BP Percentile      Pulse      Resp      Temp      Temp src      SpO2      Weight  Height      Head Circumference      Peak Flow      Pain Score      Pain Loc      Pain Education      Exclude from Growth Chart    No data found.  Updated Vital Signs BP 126/81 (BP Location: Right Arm)   Pulse 84   Temp 98.6 F (37 C) (Oral)   Resp 19   SpO2 97%   Visual Acuity Right Eye Distance:   Left Eye Distance:   Bilateral Distance:    Right Eye Near:   Left Eye Near:    Bilateral Near:     Physical Exam GEN:     alert, non-toxic appearing female in no distress    HENT:  mucus membranes moist, oropharyngeal without lesions or erythema, no tonsillar hypertrophy or exudates,  moderate erythematous edematous turbinates, thick nasal discharge, bilateral maxillary sinus tenderness, bilateral TM normal EYES:   pupils equal and reactive, no scleral injection or discharge RESP:  no increased work of breathing, clear to auscultation bilaterally CVS:    regular rate and rhythm Skin:   warm and dry, no rash on visible skin    UC Treatments / Results  Labs (all labs ordered are listed, but only abnormal results are displayed) Labs Reviewed - No data to display  EKG   Radiology No results found.  Procedures Procedures (including critical care time)  Medications Ordered in UC Medications - No data to display  Initial Impression / Assessment and Plan / UC Course  I have reviewed the triage vital signs and the nursing notes.  Pertinent labs & imaging results that were available during my care of the patient were reviewed by me and considered in my medical decision making (see chart for details).       Pt is a 48 y.o. female history of recurrent sinusitis who presents for 3 days of respiratory symptoms. On chart review, she has previously seen by Dr. Gretta, ENT and was treated for sinusitis and allergic rhinitis.    [name] is afebrile here without recent antipyretics. Satting well on room air. Overall pt is non-toxic appearing, well hydrated, without respiratory distress. Pulmonary exam is unremarkable.  COVID testing deferred at this been more than 2 days since the onset of her symptoms.  She has erythematous turbinates with thick nasal discharge and bilateral maxillary sinus tenderness.  I suspect that she may have a recurrent sinusitis.  Treat with Augmentin  as this has helped her before.  Diflucan  co-prescribed she has history of antibiotic associated yeast infections.  Typical duration of symptoms discussed.   Return and ED precautions given and voiced understanding. Discussed MDM, treatment plan and plan for follow-up with patient who agrees with plan.     Final Clinical Impressions(s) / UC Diagnoses   Final diagnoses:  Recurrent sinusitis     Discharge Instructions      Stop by the pharmacy to pick up your prescriptions.  Follow up with your primary care provider to discuss a referral to ear, nose and throat  provider.     ED Prescriptions     Medication Sig Dispense Auth. Provider   amoxicillin -clavulanate (AUGMENTIN ) 875-125 MG tablet Take 1 tablet by mouth every 12 (twelve) hours. 14 tablet Tariq Pernell, DO   fluconazole  (DIFLUCAN ) 150 MG tablet Take 1 tablet (150 mg total) by mouth once for 1 dose. 1 tablet Kriste Berth, DO      PDMP not reviewed this  encounter.   Talin Rozeboom, DO 11/20/23 1603

## 2023-11-20 NOTE — Discharge Instructions (Addendum)
 Stop by the pharmacy to pick up your prescriptions.  Follow up with your primary care provider to discuss a referral to ear, nose and throat provider.

## 2023-11-20 NOTE — ED Triage Notes (Signed)
 Sx since sat  Sudafed-nyquil-theraflu  Productive cough green mucus Post nasal drip Facial pain  Headache

## 2023-11-25 ENCOUNTER — Ambulatory Visit: Payer: Commercial Managed Care - PPO | Admitting: Podiatry

## 2023-12-09 ENCOUNTER — Ambulatory Visit: Payer: Commercial Managed Care - PPO | Admitting: Podiatry

## 2023-12-11 ENCOUNTER — Encounter: Payer: Self-pay | Admitting: Podiatry

## 2023-12-11 ENCOUNTER — Ambulatory Visit: Payer: Commercial Managed Care - PPO | Admitting: Podiatry

## 2023-12-11 DIAGNOSIS — Z9889 Other specified postprocedural states: Secondary | ICD-10-CM

## 2023-12-11 DIAGNOSIS — M7989 Other specified soft tissue disorders: Secondary | ICD-10-CM

## 2023-12-11 DIAGNOSIS — S86311D Strain of muscle(s) and tendon(s) of peroneal muscle group at lower leg level, right leg, subsequent encounter: Secondary | ICD-10-CM | POA: Diagnosis not present

## 2023-12-11 NOTE — Progress Notes (Signed)
She presents today for follow-up of her peroneal tendon repair on her right ankle.  We also performed an excision of soft tissue mass at that time.  Date of surgery was 05/31/2023.  States that is better except that 1 little spot that gets tight and my toes are not hurting like they were and they are not quite as numb as they were.  Objective: Vital signs are stable alert oriented x 3 there is no erythema Dem salines drainage or odor.  It appears that she is doing much better there is a very small area that is tight but it is quite tender to her by the end of the day.  Assessment: Well-healing ankle.  Plan: Recommended that she go to physical therapy to continue to break that up is much as possible I will follow-up with her in about 2 months at which time if she is not improved we will consider injecting fluid into the area to help break up the scar tissue.

## 2024-02-03 ENCOUNTER — Ambulatory Visit (INDEPENDENT_AMBULATORY_CARE_PROVIDER_SITE_OTHER): Payer: Commercial Managed Care - PPO | Admitting: Podiatry

## 2024-02-03 ENCOUNTER — Encounter: Payer: Self-pay | Admitting: Podiatry

## 2024-02-03 DIAGNOSIS — S86311D Strain of muscle(s) and tendon(s) of peroneal muscle group at lower leg level, right leg, subsequent encounter: Secondary | ICD-10-CM

## 2024-02-03 DIAGNOSIS — M7989 Other specified soft tissue disorders: Secondary | ICD-10-CM

## 2024-02-03 DIAGNOSIS — Z9889 Other specified postprocedural states: Secondary | ICD-10-CM

## 2024-02-03 NOTE — Progress Notes (Signed)
 She presents today date of surgery 05/31/2023 right foot.  Peroneus brevis repair and excision of soft tissue mass.  She states that is a little tender but when it swells it hurts me more than usual.  Objective: Vital signs are stable alert oriented x 3.  Pulses are palpable.  There is no erythema edema cellulitis drainage or odor she has some tenderness on palpation of the sesamoids of the right foot.  She also has some tenderness in the arch.  This very well could be compensatory.  Assessment: Well-healing surgical foot some scar tissue remaining with neuritis current sesamoiditis remains to be seen whether or not this is possibly associated with compensation.  Plan: Encouraged her to get a new pair of shoes and to continue to work on the scar tissue overlying the fourth fifth tarsometatarsal joint.

## 2024-03-27 ENCOUNTER — Emergency Department (HOSPITAL_BASED_OUTPATIENT_CLINIC_OR_DEPARTMENT_OTHER): Admission: EM | Admit: 2024-03-27 | Discharge: 2024-03-27 | Disposition: A | Discharge status: Home / Self Care

## 2024-03-27 ENCOUNTER — Emergency Department (HOSPITAL_BASED_OUTPATIENT_CLINIC_OR_DEPARTMENT_OTHER)

## 2024-03-27 ENCOUNTER — Encounter (HOSPITAL_BASED_OUTPATIENT_CLINIC_OR_DEPARTMENT_OTHER): Payer: Self-pay | Admitting: Emergency Medicine

## 2024-03-27 ENCOUNTER — Other Ambulatory Visit: Payer: Self-pay

## 2024-03-27 DIAGNOSIS — E039 Hypothyroidism, unspecified: Secondary | ICD-10-CM | POA: Insufficient documentation

## 2024-03-27 DIAGNOSIS — R109 Unspecified abdominal pain: Secondary | ICD-10-CM

## 2024-03-27 LAB — URINALYSIS, ROUTINE W REFLEX MICROSCOPIC
Bilirubin Urine: NEGATIVE
Glucose, UA: NEGATIVE mg/dL
Hgb urine dipstick: NEGATIVE
Ketones, ur: NEGATIVE mg/dL
Leukocytes,Ua: NEGATIVE
Nitrite: NEGATIVE
Protein, ur: NEGATIVE mg/dL
Specific Gravity, Urine: 1.01 (ref 1.005–1.030)
pH: 7 (ref 5.0–8.0)

## 2024-03-27 LAB — CBC
HCT: 38.5 % (ref 36.0–46.0)
Hemoglobin: 13.2 g/dL (ref 12.0–15.0)
MCH: 29.8 pg (ref 26.0–34.0)
MCHC: 34.3 g/dL (ref 30.0–36.0)
MCV: 86.9 fL (ref 80.0–100.0)
Platelets: 302 10*3/uL (ref 150–400)
RBC: 4.43 MIL/uL (ref 3.87–5.11)
RDW: 13 % (ref 11.5–15.5)
WBC: 7.3 10*3/uL (ref 4.0–10.5)
nRBC: 0 % (ref 0.0–0.2)

## 2024-03-27 LAB — BASIC METABOLIC PANEL WITH GFR
Anion gap: 12 (ref 5–15)
BUN: 10 mg/dL (ref 6–20)
CO2: 24 mmol/L (ref 22–32)
Calcium: 9.2 mg/dL (ref 8.9–10.3)
Chloride: 103 mmol/L (ref 98–111)
Creatinine, Ser: 0.72 mg/dL (ref 0.44–1.00)
GFR, Estimated: >60 mL/min (ref >60)
Glucose, Bld: 106 mg/dL — ABNORMAL HIGH (ref 70–99)
Potassium: 4.5 mmol/L (ref 3.5–5.1)
Sodium: 139 mmol/L (ref 135–145)

## 2024-03-27 LAB — PREGNANCY, URINE: Preg Test, Ur: NEGATIVE

## 2024-03-27 MED ORDER — CYCLOBENZAPRINE HCL 10 MG PO TABS: 10.0000 mg | ORAL_TABLET | Freq: Three times a day (TID) | ORAL | 0 refills | Status: AC | PRN

## 2024-03-27 NOTE — Discharge Instructions (Addendum)
 Return to this emergency department if your symptoms worsen.  Start Flexeril , take 1 tablet up to 3 times daily as needed for muscle spasms.  Continue ibuprofen , 400 mg every 4-6 hours as needed for pain.  Follow-up with your primary care provider next week if your symptoms persist.

## 2024-03-27 NOTE — ED Provider Notes (Signed)
 [place] [facility] Provider Note   CSN: [id number] Arrival date & time: 03/27/24  1522     History  Chief Complaint  Patient presents with   Flank Pain    Sherri [name] is a 48 y.o. female.  48 year old female presenting with left-sided flank pain.  Patient states symptoms began yesterday morning, they were present after she woke up.  She denies any known inciting event/injury.  Patient rates her current pain level as a 7 out of 10, her pain is exacerbated when she goes from a seated to standing position.  She denies chest pain, shortness of breath, abdominal pain, dysuria/hematuria, fevers, syncope.   Flank Pain       Home Medications Prior to Admission medications   Not on File      Allergies    Codeine, Erythromycin, Ivp dye [iodinated contrast media], Morphine and codeine, and Venlafaxine    Review of Systems   Review of Systems  Genitourinary:  Positive for flank pain.    Physical Exam Updated Vital Signs BP (!) 152/86   Pulse 65   Temp 98 F (36.7 C) (Oral)   Resp 18   Ht 5\' 5"  (1.651 m)   Wt 81.6 kg   LMP 03/13/2024   SpO2 100%   BMI 29.95 kg/m  Physical Exam Vitals and nursing note reviewed.  Constitutional:      General: She is not in acute distress. HENT:     Head: Normocephalic.  Eyes:     Extraocular Movements: Extraocular movements intact.     Pupils: Pupils are equal, round, and reactive to light.  Cardiovascular:     Rate and Rhythm: Normal rate and regular rhythm.     Heart sounds: Normal heart sounds.  Pulmonary:     Effort: Pulmonary effort is normal.     Breath sounds: Normal breath sounds.  Abdominal:     Palpations: Abdomen is soft.     Tenderness: There is no abdominal tenderness.  Musculoskeletal:     Cervical back: Normal range of motion.     Comments: Back: No TTP or palpable step-offs of spinous processes. No bruising/deformity of the back. Tenderness to palpation of the left  paraspinal muscles. Flexion and extension in-tact. Lateral rotation is in-tact but elicits pain.   Upper/lower extremities: 5/5 strength against resistance. Moves all extremities spontaneously without difficulty.  Skin:    General: Skin is warm and dry.  Neurological:     General: No focal deficit present.     Mental Status: She is alert.     Sensory: No sensory deficit.     Motor: No weakness.     ED Results / Procedures / Treatments   Labs (all labs ordered are listed, but only abnormal results are displayed) Labs Reviewed  BASIC METABOLIC PANEL WITH GFR - Abnormal; Notable for the following components:      Result Value   Glucose, Bld 106 (*)    All other components within normal limits  URINALYSIS, ROUTINE W REFLEX MICROSCOPIC  PREGNANCY, URINE  CBC    EKG None  Radiology CT Renal Stone Study Result Date: 03/27/2024 CLINICAL DATA:  Left flank pain EXAM: CT ABDOMEN AND PELVIS WITHOUT CONTRAST TECHNIQUE: Multidetector CT imaging of the abdomen and pelvis was performed following the standard protocol without IV contrast. RADIATION DOSE REDUCTION: This exam was performed according to the departmental dose-optimization program which includes automated exposure control, adjustment of the mA and/or kV according to patient size  and/or use of iterative reconstruction technique. COMPARISON:  None Available. FINDINGS: Lower chest: No acute abnormality Hepatobiliary: No focal hepatic abnormality. Gallbladder unremarkable. Pancreas: No focal abnormality or ductal dilatation. Spleen: No focal abnormality.  Normal size. Adrenals/Urinary Tract: No adrenal abnormality. No focal renal abnormality. No stones or hydronephrosis. Urinary bladder is unremarkable. Stomach/Bowel: Stomach, large and small bowel grossly unremarkable. Normal appendix. Vascular/Lymphatic: No evidence of aneurysm or adenopathy. Reproductive: Uterus and adnexa unremarkable.  No mass. Other: No free fluid or free air.  Musculoskeletal: No acute bony abnormality. IMPRESSION: No renal or ureteral stones.  No hydronephrosis. No acute findings. Electronically Signed   By: Janeece Mechanic M.D.   On: 03/27/2024 17:12    Procedures Procedures    Medications Ordered in ED Medications - No data to display  ED Course/ Medical Decision Making/ A&P                                 Medical Decision Making This patient presents to the ED for concern of flank pain, this involves an extensive number of treatment options, and is a complaint that carries with it a high risk of complications and morbidity.  The differential diagnosis includes nephrolithiasis, ureterolithiasis, pyelonephritis, urinary tract infection, muscle sprain/strain/spasm.   Co morbidities that complicate the patient evaluation  Hypothyroidism   Additional history obtained:  Additional history obtained from record review External records from outside source obtained and reviewed including prior PCP notes   Lab Tests:  I Ordered, and personally interpreted labs.  The pertinent results include: BC and BMP are unremarkable.  Urinalysis is without signs of infection, leukocyte/nitrite negative.  Urine hCG negative.   Imaging Studies ordered:  I ordered imaging studies including CT renal stone study I independently visualized and interpreted imaging which showed No renal or ureteral stones.  No hydronephrosis. No acute findings.  I agree with the radiologist interpretation   Cardiac Monitoring: / EKG:  The patient was maintained on a cardiac monitor.  I personally viewed and interpreted the cardiac monitored which showed an underlying rhythm of: Normal sinus rhythm   Problem List / ED Course / Critical interventions / Medication management  I have reviewed the patients home medicines and have made adjustments as needed   Test / Admission - Considered:  Physical exam is notable for tenderness to palpation of the left paraspinal  muscles with associated discomfort while performing lateral rotation, otherwise reassuring.  Lab work is reassuring as above.  CT scan is negative for nephrolithiasis/ureterolithiasis, no hydronephrosis.  Patient is afebrile, urinalysis without signs of infection, she has had no dysuria/hematuria therefore it is unlikely that she is suffering from a urinary tract infection/pyelonephritis.  Given patient's symptoms and physical exam findings it is likely that her pain is musculoskeletal in nature.  Discussed these findings in depth with patient, recommend patient start 10 mg Flexeril  to be used up to 3 times daily for muscle spasms, and to continue 400mg  ibuprofen  every 4-6 hours as needed for pain.  Patient is agreeable with this plan, return precautions discussed.  Patient is appropriate for discharge at this time.    Amount and/or Complexity of Data Reviewed Labs: ordered.    Details: As above.  Radiology: ordered.    Details: As above.   Risk Prescription drug management.           Final Clinical Impression(s) / ED Diagnoses Final diagnoses:  Left flank pain  Rx / DC Orders ED Discharge Orders          Ordered    cyclobenzaprine  (FLEXERIL ) 10 MG tablet  3 times daily PRN        03/27/24 1823              Kendrick Pax, PA-C 03/27/24 1852    Carin Charleston, MD 03/27/24 [phone]

## 2024-03-27 NOTE — ED Triage Notes (Signed)
 Pt c/o left sided flank pain since yesterday.  Some radiation to left side.  No dysuria.  Unknown fever.  Pt does admit to urgency.

## 2024-03-28 ENCOUNTER — Emergency Department (HOSPITAL_COMMUNITY)
Admission: EM | Admit: 2024-03-28 | Discharge: 2024-03-28 | Disposition: A | Discharge status: Home / Self Care | Attending: Emergency Medicine | Admitting: Emergency Medicine

## 2024-03-28 ENCOUNTER — Other Ambulatory Visit: Payer: Self-pay

## 2024-03-28 ENCOUNTER — Encounter (HOSPITAL_COMMUNITY): Payer: Self-pay | Admitting: Emergency Medicine

## 2024-03-28 ENCOUNTER — Emergency Department (HOSPITAL_COMMUNITY)

## 2024-03-28 DIAGNOSIS — M545 Low back pain, unspecified: Secondary | ICD-10-CM

## 2024-03-28 DIAGNOSIS — M7138 Other bursal cyst, other site: Secondary | ICD-10-CM | POA: Diagnosis not present

## 2024-03-28 DIAGNOSIS — R109 Unspecified abdominal pain: Secondary | ICD-10-CM | POA: Insufficient documentation

## 2024-03-28 DIAGNOSIS — D7589 Other specified diseases of blood and blood-forming organs: Secondary | ICD-10-CM | POA: Insufficient documentation

## 2024-03-28 DIAGNOSIS — M549 Dorsalgia, unspecified: Secondary | ICD-10-CM | POA: Diagnosis present

## 2024-03-28 DIAGNOSIS — R937 Abnormal findings on diagnostic imaging of other parts of musculoskeletal system: Secondary | ICD-10-CM

## 2024-03-28 DIAGNOSIS — M5417 Radiculopathy, lumbosacral region: Secondary | ICD-10-CM | POA: Diagnosis not present

## 2024-03-28 LAB — COMPREHENSIVE METABOLIC PANEL WITH GFR
ALT: 13 U/L (ref 0–44)
AST: 19 U/L (ref 15–41)
Albumin: 4.2 g/dL (ref 3.5–5.0)
Alkaline Phosphatase: 56 U/L (ref 38–126)
Anion gap: 8 (ref 5–15)
BUN: 8 mg/dL (ref 6–20)
CO2: 23 mmol/L (ref 22–32)
Calcium: 9.8 mg/dL (ref 8.9–10.3)
Chloride: 108 mmol/L (ref 98–111)
Creatinine, Ser: 0.72 mg/dL (ref 0.44–1.00)
GFR, Estimated: >60 mL/min (ref >60)
Glucose, Bld: 109 mg/dL — ABNORMAL HIGH (ref 70–99)
Potassium: 4 mmol/L (ref 3.5–5.1)
Sodium: 139 mmol/L (ref 135–145)
Total Bilirubin: 0.7 mg/dL (ref 0.0–1.2)
Total Protein: 7.5 g/dL (ref 6.5–8.1)

## 2024-03-28 LAB — HCG, SERUM, QUALITATIVE: Preg, Serum: NEGATIVE

## 2024-03-28 LAB — CBC WITH DIFFERENTIAL/PLATELET
Abs Immature Granulocytes: 0.04 10*3/uL (ref 0.00–0.07)
Basophils Absolute: 0 10*3/uL (ref 0.0–0.1)
Basophils Relative: 0 %
Eosinophils Absolute: 0.1 10*3/uL (ref 0.0–0.5)
Eosinophils Relative: 1 %
HCT: 42.3 % (ref 36.0–46.0)
Hemoglobin: 14.4 g/dL (ref 12.0–15.0)
Immature Granulocytes: 1 %
Lymphocytes Relative: 16 %
Lymphs Abs: 1.3 10*3/uL (ref 0.7–4.0)
MCH: 29.4 pg (ref 26.0–34.0)
MCHC: 34 g/dL (ref 30.0–36.0)
MCV: 86.3 fL (ref 80.0–100.0)
Monocytes Absolute: 0.4 10*3/uL (ref 0.1–1.0)
Monocytes Relative: 5 %
Neutro Abs: 6.3 10*3/uL (ref 1.7–7.7)
Neutrophils Relative %: 77 %
Platelets: 328 10*3/uL (ref 150–400)
RBC: 4.9 MIL/uL (ref 3.87–5.11)
RDW: 13.1 % (ref 11.5–15.5)
WBC: 8.1 10*3/uL (ref 4.0–10.5)
nRBC: 0 % (ref 0.0–0.2)

## 2024-03-28 MED ORDER — CYCLOBENZAPRINE HCL 10 MG PO TABS
5.0000 mg | ORAL_TABLET | Freq: Once | ORAL | Status: AC
Start: 2024-03-28 — End: 2024-03-28
  Administered 2024-03-28: 5 mg via ORAL
  Filled 2024-03-28: qty 1

## 2024-03-28 MED ORDER — SODIUM CHLORIDE 0.9 % IV BOLUS
1000.0000 mL | Freq: Once | INTRAVENOUS | Status: AC
  Administered 2024-03-28: 1000 mL via INTRAVENOUS

## 2024-03-28 MED ORDER — FENTANYL CITRATE PF 50 MCG/ML IJ SOSY
50.0000 ug | PREFILLED_SYRINGE | Freq: Once | INTRAMUSCULAR | Status: AC
  Administered 2024-03-28: 50 ug via INTRAVENOUS
  Filled 2024-03-28: qty 1

## 2024-03-28 MED ORDER — DIAZEPAM 5 MG/ML IJ SOLN
5.0000 mg | Freq: Once | INTRAMUSCULAR | Status: AC
  Administered 2024-03-28: 5 mg via INTRAVENOUS
  Filled 2024-03-28: qty 2

## 2024-03-28 MED ORDER — METHYLPREDNISOLONE 4 MG PO TBPK: ORAL_TABLET | ORAL | 0 refills | Status: DC

## 2024-03-28 MED ORDER — MORPHINE SULFATE (PF) 4 MG/ML IV SOLN
4.0000 mg | Freq: Once | INTRAVENOUS | Status: DC
  Filled 2024-03-28: qty 1

## 2024-03-28 MED ORDER — ONDANSETRON HCL 4 MG/2ML IJ SOLN
4.0000 mg | Freq: Once | INTRAMUSCULAR | Status: AC
  Administered 2024-03-28: 4 mg via INTRAVENOUS
  Filled 2024-03-28: qty 2

## 2024-03-28 MED ORDER — OXYCODONE-ACETAMINOPHEN 5-325 MG PO TABS: 1.0000 | ORAL_TABLET | Freq: Four times a day (QID) | ORAL | 0 refills | Status: DC | PRN

## 2024-03-28 MED ORDER — KETOROLAC TROMETHAMINE 30 MG/ML IJ SOLN
30.0000 mg | Freq: Once | INTRAMUSCULAR | Status: AC
  Administered 2024-03-28: 30 mg via INTRAVENOUS
  Filled 2024-03-28: qty 1

## 2024-03-28 MED ORDER — OXYCODONE-ACETAMINOPHEN 5-325 MG PO TABS
1.0000 | ORAL_TABLET | Freq: Once | ORAL | Status: AC
  Administered 2024-03-28: 1 via ORAL
  Filled 2024-03-28: qty 1

## 2024-03-28 MED ORDER — CYCLOBENZAPRINE HCL 10 MG PO TABS: 10.0000 mg | ORAL_TABLET | Freq: Three times a day (TID) | ORAL | 0 refills | Status: AC | PRN

## 2024-03-28 NOTE — ED Triage Notes (Signed)
 Pt endorses left sided flank pain since Thursday am. Seen at [facility] yesterday where labs and CT were done. Pain worsened this morning. Pain radiates to front of abd.

## 2024-03-28 NOTE — ED Notes (Signed)
 Pt would like to have fentanyl  after MRI, pain managed right now with previous medications (See MAR)

## 2024-03-28 NOTE — Discharge Instructions (Addendum)
 As we discussed, you have multiple reasons for your back pain.  You have multiple areas of edema and cyst and pinched nerves  I have prescribed Medrol  Dosepak as well as Percocet for pain  You can continue your Flexeril  as prescribed by your doctor yesterday  I recommend that you follow-up with neurosurgery.  You can contact your orthopedic doctor first  Return to ER if you have worse back pain or trouble walking or numbness or weakness

## 2024-03-28 NOTE — ED Provider Notes (Signed)
 [place] [facility] Provider Note   CSN: [id number] Arrival date & time: 03/28/24  1352     History  Chief Complaint  Patient presents with   Flank Pain    Sherri [name] is a 48 y.o. female here presenting with back pain.  Patient was seen yesterday at med center for left-sided flank pain and back pain.  Patient had a negative CT renal stone and normal urinalysis.  Patient was thought to have musculoskeletal pain.  Patient states that her pain started about 2 days ago when she woke up.  She states that the pain radiates into her groin.  Patient states that the pain is worse with movement especially when she walks.  Patient states that she has a chronic lumbar disc protrusion and had required injections in the past.  She denies any fevers or trouble urinating  The history is provided by the patient.       Home Medications Prior to Admission medications   Medication Sig Start Date End Date Taking? Authorizing Provider  cyclobenzaprine  (FLEXERIL ) 10 MG tablet Take 1 tablet (10 mg total) by mouth 3 (three) times daily as needed for muscle spasms. 03/27/24   Kendrick Pax, PA-C      Allergies    Codeine, Erythromycin, Ivp dye [iodinated contrast media], Morphine and codeine, and Venlafaxine    Review of Systems   Review of Systems  Genitourinary:  Positive for flank pain.  All other systems reviewed and are negative.   Physical Exam Updated Vital Signs BP (!) 122/92   Pulse 73   Temp 97.9 F (36.6 C) (Oral)   Resp 16   LMP 03/13/2024   SpO2 100%  Physical Exam Vitals and nursing note reviewed.  Constitutional:      Appearance: Normal appearance.  HENT:     Head: Normocephalic.     Nose: Nose normal.     Mouth/Throat:     Mouth: Mucous membranes are moist.  Eyes:     Extraocular Movements: Extraocular movements intact.     Pupils: Pupils are equal, round, and reactive to light.  Cardiovascular:     Rate and Rhythm: Normal rate  and regular rhythm.     Pulses: Normal pulses.     Heart sounds: Normal heart sounds.  Pulmonary:     Effort: Pulmonary effort is normal.     Breath sounds: Normal breath sounds.  Abdominal:     General: Abdomen is flat.     Palpations: Abdomen is soft.     Comments: + Left CVA versus paralumbar tenderness  Musculoskeletal:     Cervical back: Normal range of motion and neck supple.     Comments: Positive straight leg raise on the left.  Patient has no obvious saddle anesthesia.  Normal reflexes bilateral knees  Skin:    General: Skin is warm.     Capillary Refill: Capillary refill takes less than 2 seconds.  Neurological:     General: No focal deficit present.     Mental Status: She is alert and oriented to person, place, and time.     Comments: Patient has normal reflexes bilateral knees and positive straight leg raise on the left  Psychiatric:        Mood and Affect: Mood normal.        Behavior: Behavior normal.     ED Results / Procedures / Treatments   Labs (all labs ordered are listed, but only abnormal results are displayed)  Labs Reviewed  CBC WITH DIFFERENTIAL/PLATELET  COMPREHENSIVE METABOLIC PANEL WITH GFR  URINALYSIS, W/ REFLEX TO CULTURE (INFECTION SUSPECTED)  HCG, SERUM, QUALITATIVE    EKG None  Radiology CT Renal Stone Study Result Date: 03/27/2024 CLINICAL DATA:  Left flank pain EXAM: CT ABDOMEN AND PELVIS WITHOUT CONTRAST TECHNIQUE: Multidetector CT imaging of the abdomen and pelvis was performed following the standard protocol without IV contrast. RADIATION DOSE REDUCTION: This exam was performed according to the departmental dose-optimization program which includes automated exposure control, adjustment of the mA and/or kV according to patient size and/or use of iterative reconstruction technique. COMPARISON:  None Available. FINDINGS: Lower chest: No acute abnormality Hepatobiliary: No focal hepatic abnormality. Gallbladder unremarkable. Pancreas: No focal  abnormality or ductal dilatation. Spleen: No focal abnormality.  Normal size. Adrenals/Urinary Tract: No adrenal abnormality. No focal renal abnormality. No stones or hydronephrosis. Urinary bladder is unremarkable. Stomach/Bowel: Stomach, large and small bowel grossly unremarkable. Normal appendix. Vascular/Lymphatic: No evidence of aneurysm or adenopathy. Reproductive: Uterus and adnexa unremarkable.  No mass. Other: No free fluid or free air. Musculoskeletal: No acute bony abnormality. IMPRESSION: No renal or ureteral stones.  No hydronephrosis. No acute findings. Electronically Signed   By: Janeece Mechanic M.D.   On: 03/27/2024 17:12    Procedures Procedures    Medications Ordered in ED Medications  morphine (PF) 4 MG/ML injection 4 mg (4 mg Intravenous Not Given 03/28/24 1627)  sodium chloride  0.9 % bolus 1,000 mL (1,000 mLs Intravenous New Bag/Given 03/28/24 1619)  ketorolac  (TORADOL ) 30 MG/ML injection 30 mg (30 mg Intravenous Given 03/28/24 1609)  ondansetron  (ZOFRAN ) injection 4 mg (4 mg Intravenous Given 03/28/24 1606)  diazepam (VALIUM) injection 5 mg (5 mg Intravenous Given 03/28/24 1612)    ED Course/ Medical Decision Making/ A&P                                 Medical Decision Making Sherri [name] is a 48 y.o. female here presenting with back pain radiates to the left leg.  Concern for possible cauda equina syndrome.  Patient had negative CT renal stone yesterday.  Plan to get CBC CMP and hCG.  Will give pain medicine and muscle relaxant and get MRI lumbar spine without contrast  7:40 PM I reviewed patient's labs and imaging studies.  Patient's labs are unremarkable.  Imaging study showed L4-5 anterolisthesis and also bilateral synovial cysts and also degenerative changes and bone marrow edema.  Patient has multiple reason for her back pain and radiculopathy symptoms.  Will give a course of steroids and give pain medicine.  Will refer to spine surgery for follow-up  Problems  Addressed: Bone marrow edema: acute illness or injury Lumbosacral radiculopathy at L1: acute illness or injury Synovial cyst of lumbar facet joint: acute illness or injury  Amount and/or Complexity of Data Reviewed Labs: ordered. Decision-making details documented in ED Course. Radiology: ordered and independent interpretation performed. Decision-making details documented in ED Course.  Risk Prescription drug management.    Final Clinical Impression(s) / ED Diagnoses Final diagnoses:  None    Rx / DC Orders ED Discharge Orders     None         Dalene Duck, MD 03/28/24 1949

## 2024-04-02 ENCOUNTER — Ambulatory Visit
Admission: RE | Admit: 2024-04-02 | Discharge: 2024-04-02 | Disposition: A | Discharge status: Home / Self Care | Source: Ambulatory Visit | Attending: Family Medicine | Admitting: Family Medicine

## 2024-04-02 ENCOUNTER — Other Ambulatory Visit: Payer: Self-pay | Admitting: Family Medicine

## 2024-04-02 DIAGNOSIS — R102 Pelvic and perineal pain: Secondary | ICD-10-CM

## 2024-04-03 ENCOUNTER — Encounter: Payer: Self-pay | Admitting: Family Medicine

## 2024-04-03 DIAGNOSIS — R102 Pelvic and perineal pain: Secondary | ICD-10-CM

## 2024-04-07 MED ORDER — OXYCODONE-ACETAMINOPHEN 5-325 MG PO TABS: 1.0000 | ORAL_TABLET | Freq: Four times a day (QID) | ORAL | 0 refills | Status: DC | PRN

## 2024-04-21 ENCOUNTER — Encounter: Payer: Self-pay | Admitting: Family Medicine

## 2024-04-21 ENCOUNTER — Ambulatory Visit: Admitting: Family Medicine

## 2024-04-21 VITALS — BP 134/87 | HR 70 | Wt 196.0 lb

## 2024-04-21 DIAGNOSIS — E038 Other specified hypothyroidism: Secondary | ICD-10-CM

## 2024-04-21 DIAGNOSIS — N939 Abnormal uterine and vaginal bleeding, unspecified: Secondary | ICD-10-CM | POA: Diagnosis not present

## 2024-04-21 MED ORDER — LEVOTHYROXINE SODIUM 25 MCG PO TABS: 25.0000 ug | ORAL_TABLET | Freq: Every day | ORAL | 1 refills | Status: DC

## 2024-04-21 MED ORDER — NORETHINDRONE ACETATE 5 MG PO TABS: 5.0000 mg | ORAL_TABLET | Freq: Every day | ORAL | 3 refills | Status: DC

## 2024-04-21 NOTE — Progress Notes (Unsigned)
   Subjective:    Patient ID: Sherri [name] is a 48 y.o. female presenting with Follow-up  on 04/21/2024  HPI: Has had several days of severe pain.  Cycle started. She is s/p ablation and cycles are still quite heavy. Has a lot of life stressors at present. Seen in ED and noted on u/s to have some fibroids. ? Degeneration. H/o SVD and C-section x 1 with BTL.  Review of Systems  Constitutional:  Negative for chills and fever.  Respiratory:  Negative for shortness of breath.   Cardiovascular:  Negative for chest pain.  Gastrointestinal:  Negative for abdominal pain, nausea and vomiting.  Genitourinary:  Negative for dysuria.  Skin:  Negative for rash.      Objective:    BP 134/87   Pulse 70   Wt 196 lb (88.9 kg)   LMP 03/13/2024   BMI 32.62 kg/m  Physical Exam Exam conducted with a chaperone present.  Constitutional:      General: She is not in acute distress.    Appearance: She is well-developed.  HENT:     Head: Normocephalic and atraumatic.  Eyes:     General: No scleral icterus. Cardiovascular:     Rate and Rhythm: Normal rate.  Pulmonary:     Effort: Pulmonary effort is normal.  Abdominal:     Palpations: Abdomen is soft.  Musculoskeletal:     Cervical back: Neck supple.  Skin:    General: Skin is warm and dry.  Neurological:     Mental Status: She is alert and oriented to person, place, and time.         Assessment & Plan:   Problem List Items Addressed This Visit       Unprioritized   Hypothyroid   With slightly enlarged thyroid . She has been euthyroid lately, but would like to have a low dose for suppression of thyroid  growth. Has had u/s which is read as normal though the right side is larger than the left.  Will give a low dose and check TSH today and again in 4-6 weeks. If TSH is suppressed, would stop this.      Relevant Medications   levothyroxine (SYNTHROID) 25 MCG tablet   Other Relevant Orders   TSH Rfx on Abnormal to Free T4  (Completed)   Abnormal uterine bleeding - Primary   Has fibroid and likely adenomyosis. She is s/p HTA but still bleeding. Has tried and failed IUD in the past and COC's. Offered all options, but she is not ready for surgery, cannot miss work and prefers a trial of oral meds. Will start Aygestin  and see how this goes. Offered lysteda, but she does not think she will remember to take it only during cycles.      Relevant Medications   norethindrone  (AYGESTIN ) 5 MG tablet     Return in about 4 weeks (around 05/19/2024) for a follow-up.  Granville Layer, MD 04/21/2024 4:23 PM

## 2024-04-21 NOTE — Progress Notes (Unsigned)
 RGYN F/U U/S from 04/02/24.

## 2024-04-22 ENCOUNTER — Ambulatory Visit: Payer: Self-pay | Admitting: Family Medicine

## 2024-04-22 ENCOUNTER — Encounter: Payer: Self-pay | Admitting: Family Medicine

## 2024-04-22 DIAGNOSIS — N939 Abnormal uterine and vaginal bleeding, unspecified: Secondary | ICD-10-CM | POA: Insufficient documentation

## 2024-04-22 LAB — TSH RFX ON ABNORMAL TO FREE T4: TSH: 0.839 u[IU]/mL (ref 0.450–4.500)

## 2024-04-22 NOTE — Assessment & Plan Note (Signed)
 Has fibroid and likely adenomyosis. She is s/p HTA but still bleeding. Has tried and failed IUD in the past and COC's. Offered all options, but she is not ready for surgery, cannot miss work and prefers a trial of oral meds. Will start Aygestin  and see how this goes. Offered lysteda, but she does not think she will remember to take it only during cycles.

## 2024-04-22 NOTE — Assessment & Plan Note (Signed)
 With slightly enlarged thyroid . She has been euthyroid lately, but would like to have a low dose for suppression of thyroid  growth. Has had u/s which is read as normal though the right side is larger than the left.  Will give a low dose and check TSH today and again in 4-6 weeks. If TSH is suppressed, would stop this.

## 2024-05-31 ENCOUNTER — Other Ambulatory Visit: Payer: Self-pay | Admitting: Family Medicine

## 2024-05-31 DIAGNOSIS — E038 Other specified hypothyroidism: Secondary | ICD-10-CM

## 2024-06-11 ENCOUNTER — Ambulatory Visit: Admitting: Family Medicine

## 2024-06-11 VITALS — BP 120/78 | HR 87 | Wt 195.0 lb

## 2024-06-11 DIAGNOSIS — N939 Abnormal uterine and vaginal bleeding, unspecified: Secondary | ICD-10-CM | POA: Diagnosis not present

## 2024-06-11 DIAGNOSIS — E038 Other specified hypothyroidism: Secondary | ICD-10-CM | POA: Diagnosis not present

## 2024-06-11 MED ORDER — DOXYCYCLINE HYCLATE 100 MG PO CAPS: 100.0000 mg | ORAL_CAPSULE | Freq: Two times a day (BID) | ORAL | 0 refills | Status: DC

## 2024-06-11 MED ORDER — NORETHINDRONE ACETATE 5 MG PO TABS: 10.0000 mg | ORAL_TABLET | Freq: Every day | ORAL | 3 refills | Status: DC

## 2024-06-11 NOTE — Assessment & Plan Note (Signed)
 Repeat her TSH next time. Has enlarged thyroid  and notes she can tolerate food better while on meds. If suppressed TSH at next visit, consider D/Cing the suppression.

## 2024-06-11 NOTE — Assessment & Plan Note (Signed)
 Trial of doxycycline  Increase aygestin  to 10 mg daily She cannot afford time off for surgery at this time.

## 2024-06-11 NOTE — Progress Notes (Signed)
   Subjective:    Patient ID: Sherri [name] is a 48 y.o. female presenting with Follow-up  on 06/11/2024  HPI: Has had heavy vaginal bleeding. She is s/p HTA and IUD and has not had relief. We started aygestin  last visit, but she continues to have bleeding.  Review of Systems  Constitutional:  Negative for chills and fever.  Respiratory:  Negative for shortness of breath.   Cardiovascular:  Negative for chest pain.  Gastrointestinal:  Negative for abdominal pain, nausea and vomiting.  Genitourinary:  Negative for dysuria.  Skin:  Negative for rash.      Objective:    BP 120/78   Pulse 87   Wt 195 lb (88.5 kg)   LMP 05/13/2024 (Exact Date)   BMI 32.45 kg/m  Physical Exam Exam conducted with a chaperone present.  Constitutional:      General: She is not in acute distress.    Appearance: She is well-developed.  HENT:     Head: Normocephalic and atraumatic.  Eyes:     General: No scleral icterus. Neck:     Thyroid : Thyromegaly present.  Cardiovascular:     Rate and Rhythm: Normal rate.  Pulmonary:     Effort: Pulmonary effort is normal.  Abdominal:     Palpations: Abdomen is soft.  Musculoskeletal:     Cervical back: Neck supple.  Skin:    General: Skin is warm and dry.  Neurological:     Mental Status: She is alert and oriented to person, place, and time.         Assessment & Plan:   Problem List Items Addressed This Visit       Unprioritized   Hypothyroid - Primary   Repeat her TSH next time. Has enlarged thyroid  and notes she can tolerate food better while on meds. If suppressed TSH at next visit, consider D/Cing the suppression.      Abnormal uterine bleeding   Trial of doxycycline  Increase aygestin  to 10 mg daily She cannot afford time off for surgery at this time.      Relevant Medications   norethindrone  (AYGESTIN ) 5 MG tablet   doxycycline  (VIBRAMYCIN ) 100 MG capsule     Return in about 6 weeks (around 07/23/2024) for a  follow-up.  [name], MD 06/11/2024 4:02 PM

## 2024-06-11 NOTE — Progress Notes (Signed)
 RGYN for F/U on AUB and medication management Aygestin .  Pt is still having vaginal bleeding that is heavy/saturating a pad.

## 2024-06-21 ENCOUNTER — Emergency Department
Admission: EM | Admit: 2024-06-21 | Discharge: 2024-06-21 | Disposition: A | Discharge status: Home / Self Care | Attending: Emergency Medicine | Admitting: Emergency Medicine

## 2024-06-21 ENCOUNTER — Emergency Department

## 2024-06-21 ENCOUNTER — Other Ambulatory Visit: Payer: Self-pay

## 2024-06-21 DIAGNOSIS — S3991XA Unspecified injury of abdomen, initial encounter: Secondary | ICD-10-CM | POA: Diagnosis present

## 2024-06-21 DIAGNOSIS — S3981XA Other specified injuries of abdomen, initial encounter: Secondary | ICD-10-CM | POA: Diagnosis not present

## 2024-06-21 DIAGNOSIS — Y990 Civilian activity done for income or pay: Secondary | ICD-10-CM | POA: Insufficient documentation

## 2024-06-21 DIAGNOSIS — E039 Hypothyroidism, unspecified: Secondary | ICD-10-CM | POA: Diagnosis not present

## 2024-06-21 LAB — URINALYSIS, ROUTINE W REFLEX MICROSCOPIC
Bilirubin Urine: NEGATIVE
Glucose, UA: NEGATIVE mg/dL
Hgb urine dipstick: NEGATIVE
Ketones, ur: 5 mg/dL — AB
Nitrite: NEGATIVE
Protein, ur: NEGATIVE mg/dL
Specific Gravity, Urine: 1.018 (ref 1.005–1.030)
pH: 5 (ref 5.0–8.0)

## 2024-06-21 LAB — COMPREHENSIVE METABOLIC PANEL WITH GFR
ALT: 24 U/L (ref 0–44)
AST: 22 U/L (ref 15–41)
Albumin: 4 g/dL (ref 3.5–5.0)
Alkaline Phosphatase: 44 U/L (ref 38–126)
Anion gap: 10 (ref 5–15)
BUN: 14 mg/dL (ref 6–20)
CO2: 21 mmol/L — ABNORMAL LOW (ref 22–32)
Calcium: 9.7 mg/dL (ref 8.9–10.3)
Chloride: 106 mmol/L (ref 98–111)
Creatinine, Ser: 0.68 mg/dL (ref 0.44–1.00)
GFR, Estimated: >60 mL/min (ref >60)
Glucose, Bld: 89 mg/dL (ref 70–99)
Potassium: 3.6 mmol/L (ref 3.5–5.1)
Sodium: 137 mmol/L (ref 135–145)
Total Bilirubin: 0.7 mg/dL (ref 0.0–1.2)
Total Protein: 7.7 g/dL (ref 6.5–8.1)

## 2024-06-21 LAB — CBC
HCT: 40.4 % (ref 36.0–46.0)
Hemoglobin: 13.7 g/dL (ref 12.0–15.0)
MCH: 29.7 pg (ref 26.0–34.0)
MCHC: 33.9 g/dL (ref 30.0–36.0)
MCV: 87.4 fL (ref 80.0–100.0)
Platelets: 296 K/uL (ref 150–400)
RBC: 4.62 MIL/uL (ref 3.87–5.11)
RDW: 13.3 % (ref 11.5–15.5)
WBC: 8.2 K/uL (ref 4.0–10.5)
nRBC: 0 % (ref 0.0–0.2)

## 2024-06-21 LAB — LIPASE, BLOOD: Lipase: 28 U/L (ref 11–51)

## 2024-06-21 MED ORDER — ONDANSETRON 4 MG PO TBDP
4.0000 mg | ORAL_TABLET | Freq: Once | ORAL | Status: AC
  Administered 2024-06-21: 4 mg via ORAL
  Filled 2024-06-21: qty 1

## 2024-06-21 MED ORDER — ACETAMINOPHEN 325 MG PO TABS
650.0000 mg | ORAL_TABLET | Freq: Once | ORAL | Status: AC
  Administered 2024-06-21: 650 mg via ORAL
  Filled 2024-06-21: qty 2
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
## 2024-06-21 NOTE — Discharge Instructions (Addendum)
 Your exam, labs, and CT scan are normal and reassuring this time.  No evidence of any serious intra-abdominal injury.  Take your previously prescribed muscle relaxant along with OTC ibuprofen  or Aleve as discussed.  Follow-up with [facility] for any ongoing evaluation.
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
## 2024-07-21 ENCOUNTER — Ambulatory Visit (INDEPENDENT_AMBULATORY_CARE_PROVIDER_SITE_OTHER): Admitting: Family Medicine

## 2024-07-21 DIAGNOSIS — N939 Abnormal uterine and vaginal bleeding, unspecified: Secondary | ICD-10-CM

## 2024-07-21 MED ORDER — NORETHINDRONE ACETATE 5 MG PO TABS: 10.0000 mg | ORAL_TABLET | Freq: Every day | ORAL | 3 refills | Status: AC

## 2024-07-21 MED ORDER — DOXYCYCLINE HYCLATE 100 MG PO CAPS: 100.0000 mg | ORAL_CAPSULE | Freq: Two times a day (BID) | ORAL | 0 refills | Status: DC

## 2024-07-21 NOTE — Progress Notes (Signed)
 RGYN here to F/U on AUB.  Pt states she is doing better no longer having any bleeding . Requesting refills on Aygestin  and Doxycycline .

## 2024-07-21 NOTE — Assessment & Plan Note (Signed)
 Continue aygestin . She is having some side effects of weight gain and hair loss. Consider IUD insertion after this if they continue. Refilled her doxycycline  x 1.

## 2024-07-21 NOTE — Progress Notes (Signed)
    Subjective:    Patient ID: Sherri [name] is a 48 y.o. female presenting with Follow-up  on 07/21/2024  HPI: Took doxycycline  and aygestin . Now on 10 mg and no bleeding x 43 days. Side effect of weight gain and hair loss.  Review of Systems  Constitutional:  Negative for chills and fever.  Respiratory:  Negative for shortness of breath.   Cardiovascular:  Negative for chest pain.  Gastrointestinal:  Negative for abdominal pain, nausea and vomiting.  Genitourinary:  Negative for dysuria.  Skin:  Negative for rash.      Objective:    BP 138/84   Pulse 82   Wt 200 lb (90.7 kg)   BMI 33.28 kg/m  Physical Exam Exam conducted with a chaperone present.  Constitutional:      General: She is not in acute distress.    Appearance: She is well-developed.  HENT:     Head: Normocephalic and atraumatic.  Eyes:     General: No scleral icterus. Cardiovascular:     Rate and Rhythm: Normal rate.  Pulmonary:     Effort: Pulmonary effort is normal.  Abdominal:     Palpations: Abdomen is soft.  Musculoskeletal:     Cervical back: Neck supple.  Skin:    General: Skin is warm and dry.  Neurological:     Mental Status: She is alert and oriented to person, place, and time.         Assessment & Plan:   Problem List Items Addressed This Visit       Unprioritized   Abnormal uterine bleeding   Continue aygestin . She is having some side effects of weight gain and hair loss. Consider IUD insertion after this if they continue. Refilled her doxycycline  x 1.      Relevant Medications   norethindrone  (AYGESTIN ) 5 MG tablet   doxycycline  (VIBRAMYCIN ) 100 MG capsule    Return in about 2 months (around 09/20/2024).  [name], MD 07/21/2024 4:30 PM

## 2024-07-22 ENCOUNTER — Encounter: Payer: Self-pay | Admitting: Family Medicine

## 2024-08-07 ENCOUNTER — Encounter: Payer: Self-pay | Admitting: Family Medicine

## 2024-08-07 ENCOUNTER — Ambulatory Visit: Admitting: Family Medicine

## 2024-08-07 VITALS — BP 175/107 | HR 81 | Ht 65.0 in | Wt 200.0 lb

## 2024-08-07 DIAGNOSIS — R030 Elevated blood-pressure reading, without diagnosis of hypertension: Secondary | ICD-10-CM | POA: Diagnosis not present

## 2024-08-07 DIAGNOSIS — R232 Flushing: Secondary | ICD-10-CM | POA: Diagnosis not present

## 2024-08-07 DIAGNOSIS — N939 Abnormal uterine and vaginal bleeding, unspecified: Secondary | ICD-10-CM | POA: Diagnosis not present

## 2024-08-07 DIAGNOSIS — E038 Other specified hypothyroidism: Secondary | ICD-10-CM

## 2024-08-07 DIAGNOSIS — Z30430 Encounter for insertion of intrauterine contraceptive device: Secondary | ICD-10-CM | POA: Diagnosis not present

## 2024-08-07 MED ORDER — LEVOTHYROXINE SODIUM 25 MCG PO TABS: 25.0000 ug | ORAL_TABLET | Freq: Every day | ORAL | 1 refills | Status: DC

## 2024-08-07 MED ORDER — ESTRADIOL 0.025 MG/24HR TD PTTW: 1.0000 | MEDICATED_PATCH | TRANSDERMAL | 12 refills | Status: DC

## 2024-08-07 MED ORDER — LEVONORGESTREL 20 MCG/DAY IU IUD
1.0000 | INTRAUTERINE_SYSTEM | Freq: Once | INTRAUTERINE | Status: AC
  Administered 2024-08-07: 1 via INTRAUTERINE

## 2024-08-07 NOTE — Assessment & Plan Note (Signed)
 Refilled her Synthroid  for a few months.

## 2024-08-07 NOTE — Assessment & Plan Note (Signed)
 Has home cuff--to keep eye on BP. If worsens, may need to re-think estrogen.

## 2024-08-07 NOTE — Assessment & Plan Note (Signed)
 S/p IUD placement and stop aygestin .

## 2024-08-07 NOTE — Progress Notes (Addendum)
   Subjective:    Patient ID: Sherri [name] is a 48 y.o. female presenting with Contraception (No period for 60 days )  on 08/07/2024  HPI: Here for IUD insertion. She is s/p HTA and BTL. She has had bleeding and she has had success on Aygestin . She has had weight gain. Her insurance decided to stop paying for the medication. She has decided to place IUD. Patient also reports significant hot flashes and night sweats that drench the bed. She reports no menses x 60 days, ? Due to Aygestin . Also with painful cycles.   Review of Systems  Constitutional:  Negative for chills and fever.  Respiratory:  Negative for shortness of breath.   Cardiovascular:  Negative for chest pain.  Gastrointestinal:  Negative for abdominal pain, nausea and vomiting.  Genitourinary:  Negative for dysuria.  Skin:  Negative for rash.      Objective:    BP (!) 151/88   Pulse 81   Ht 5' 5 (1.651 m)   Wt 200 lb (90.7 kg)   LMP 06/07/2024   BMI 33.28 kg/m  Physical Exam Exam conducted with a chaperone present.  Constitutional:      General: She is not in acute distress.    Appearance: She is well-developed.  HENT:     Head: Normocephalic and atraumatic.  Eyes:     General: No scleral icterus. Cardiovascular:     Rate and Rhythm: Normal rate.  Pulmonary:     Effort: Pulmonary effort is normal.  Abdominal:     Palpations: Abdomen is soft.  Genitourinary:    Comments: BUS normal, vagina is pink and rugated, cervix is parous without lesion  Musculoskeletal:     Cervical back: Neck supple.  Skin:    General: Skin is warm and dry.  Neurological:     Mental Status: She is alert and oriented to person, place, and time.   Procedure: Patient identified, informed consent performed, signed copy in chart, time out was performed.  Urine pregnancy test negative.  Speculum placed in the vagina.  Cervix visualized.  Cleaned with Betadine  x 2.  Grasped anteriourly with a single tooth tenaculum.  Uterus sounded  to 9 cm.  Mirena  IUD placed per manufacturer's recommendations.  Strings trimmed to 3 cm.      Assessment & Plan:   Problem List Items Addressed This Visit       Unprioritized   Hypothyroid   Refilled her Synthroid  for a few months.      Relevant Medications   levothyroxine  (SYNTHROID ) 25 MCG tablet   Elevated blood pressure reading   Has home cuff--to keep eye on BP. If worsens, may need to re-think estrogen.      Abnormal uterine bleeding   S/p IUD placement and stop aygestin .      Hot flashes - Primary   Trial of vivelle  dot. Start at lowest dose. Reviewed risks of worsening HTN, MI, CVA, clotting, breast cancer. Benefits include bone health, hot flashes, vaginal health, decreased risk of dementia.      Relevant Medications   estradiol  (VIVELLE -DOT) 0.025 MG/24HR (Start on 08/10/2024)    Return in about 6 weeks (around 09/18/2024) for iud check, a follow-up.  [name], MD 08/07/2024 8:57 AM

## 2024-08-07 NOTE — Assessment & Plan Note (Signed)
 Trial of vivelle  dot. Start at lowest dose. Reviewed risks of worsening HTN, MI, CVA, clotting, breast cancer. Benefits include bone health, hot flashes, vaginal health, decreased risk of dementia.

## 2024-09-08 ENCOUNTER — Other Ambulatory Visit: Payer: Self-pay | Admitting: Family Medicine

## 2024-09-08 DIAGNOSIS — E038 Other specified hypothyroidism: Secondary | ICD-10-CM

## 2024-09-17 ENCOUNTER — Ambulatory Visit: Admitting: Family Medicine

## 2024-09-17 VITALS — BP 153/102 | HR 80

## 2024-09-17 DIAGNOSIS — N939 Abnormal uterine and vaginal bleeding, unspecified: Secondary | ICD-10-CM | POA: Diagnosis not present

## 2024-09-17 DIAGNOSIS — R232 Flushing: Secondary | ICD-10-CM

## 2024-09-17 MED ORDER — ESTRADIOL 0.0375 MG/24HR TD PTTW
1.0000 | MEDICATED_PATCH | TRANSDERMAL | 12 refills | Status: DC
Start: 2024-09-17 — End: 2024-10-06

## 2024-09-17 NOTE — Progress Notes (Signed)
    Subjective:    Patient ID: Sherri [name] is a 48 y.o. female presenting with Follow-up  on 09/17/2024  HPI: Here for f/u. Had IUD placed. She has increased bleeding in last 2 days and does not want her IUD checked right now. Still having hot flashes. We had started low dose Vivelle .  Review of Systems  Constitutional:  Negative for chills and fever.  Respiratory:  Negative for shortness of breath.   Cardiovascular:  Negative for chest pain.  Gastrointestinal:  Negative for abdominal pain, nausea and vomiting.  Genitourinary:  Negative for dysuria.  Skin:  Negative for rash.      Objective:    BP (!) 153/102   Pulse 80  Physical Exam Exam conducted with a chaperone present.  Constitutional:      General: She is not in acute distress.    Appearance: She is well-developed.  HENT:     Head: Normocephalic and atraumatic.  Eyes:     General: No scleral icterus. Cardiovascular:     Rate and Rhythm: Normal rate.  Pulmonary:     Effort: Pulmonary effort is normal.  Abdominal:     Palpations: Abdomen is soft.  Musculoskeletal:     Cervical back: Neck supple.  Skin:    General: Skin is warm and dry.  Neurological:     Mental Status: She is alert and oriented to person, place, and time.         Assessment & Plan:   Problem List Items Addressed This Visit       Unprioritized   Abnormal uterine bleeding - Primary   S/p IUD. Continue to watch bleeding.      Hot flashes   Worsening - Increase her Vivelle  to 0.0375.      Relevant Medications   estradiol  (VIVELLE -DOT) 0.0375 MG/24HR    Return in about 4 weeks (around 10/15/2024) for iud check, a follow-up.  [name], MD 09/17/2024 4:30 PM

## 2024-09-17 NOTE — Progress Notes (Signed)
 RGYN here for Follow/Up IUD insertion and medication management.  Notes light bleeding.    CC: None

## 2024-09-19 ENCOUNTER — Encounter: Payer: Self-pay | Admitting: Family Medicine

## 2024-09-19 NOTE — Assessment & Plan Note (Signed)
 Worsening - Increase her Vivelle  to 0.0375.

## 2024-09-19 NOTE — Assessment & Plan Note (Signed)
 S/p IUD. Continue to watch bleeding.

## 2024-10-05 ENCOUNTER — Encounter: Payer: Self-pay | Admitting: Family Medicine

## 2024-10-05 DIAGNOSIS — R232 Flushing: Secondary | ICD-10-CM

## 2024-10-06 MED ORDER — ESTRADIOL 0.05 MG/24HR TD PTTW: 1.0000 | MEDICATED_PATCH | TRANSDERMAL | 12 refills | Status: AC

## 2024-10-09 ENCOUNTER — Other Ambulatory Visit: Payer: Self-pay | Admitting: Family Medicine

## 2024-10-09 DIAGNOSIS — E038 Other specified hypothyroidism: Secondary | ICD-10-CM

## 2024-10-13 ENCOUNTER — Other Ambulatory Visit: Payer: Self-pay | Admitting: Family Medicine

## 2024-10-13 DIAGNOSIS — E038 Other specified hypothyroidism: Secondary | ICD-10-CM

## 2024-10-26 ENCOUNTER — Telehealth: Admitting: Physician Assistant

## 2024-10-26 DIAGNOSIS — J019 Acute sinusitis, unspecified: Secondary | ICD-10-CM | POA: Diagnosis not present

## 2024-10-26 DIAGNOSIS — B9689 Other specified bacterial agents as the cause of diseases classified elsewhere: Secondary | ICD-10-CM | POA: Diagnosis not present

## 2024-10-26 MED ORDER — AMOXICILLIN-POT CLAVULANATE 875-125 MG PO TABS: 1.0000 | ORAL_TABLET | Freq: Two times a day (BID) | ORAL | 0 refills | Status: DC

## 2024-10-26 NOTE — Progress Notes (Signed)

## 2024-11-14 ENCOUNTER — Other Ambulatory Visit: Payer: Self-pay | Admitting: Family Medicine

## 2024-11-14 DIAGNOSIS — E038 Other specified hypothyroidism: Secondary | ICD-10-CM

## 2024-11-18 ENCOUNTER — Encounter: Payer: Self-pay | Admitting: Family Medicine

## 2024-11-18 DIAGNOSIS — B9689 Other specified bacterial agents as the cause of diseases classified elsewhere: Secondary | ICD-10-CM

## 2024-11-23 ENCOUNTER — Ambulatory Visit: Admitting: Podiatry

## 2024-11-26 ENCOUNTER — Ambulatory Visit (INDEPENDENT_AMBULATORY_CARE_PROVIDER_SITE_OTHER): Admitting: Family Medicine

## 2024-11-26 VITALS — BP 158/94 | HR 75 | Wt 210.0 lb

## 2024-11-26 DIAGNOSIS — N939 Abnormal uterine and vaginal bleeding, unspecified: Secondary | ICD-10-CM | POA: Diagnosis not present

## 2024-11-26 DIAGNOSIS — Z30431 Encounter for routine checking of intrauterine contraceptive device: Secondary | ICD-10-CM

## 2024-11-26 DIAGNOSIS — R232 Flushing: Secondary | ICD-10-CM | POA: Diagnosis not present

## 2024-11-26 DIAGNOSIS — N907 Vulvar cyst: Secondary | ICD-10-CM

## 2024-11-26 NOTE — Progress Notes (Unsigned)
" ° °  Subjective:    Patient ID: Sherri [name] is a 49 y.o. female presenting with Contraception (Iud check)  on 11/26/2024  HPI: Pt. Is s/p HTA and trial of aygestin . Recently placed an IUD. She has had bleeding since them.  Review of Systems  Constitutional:  Negative for chills and fever.  Respiratory:  Negative for shortness of breath.   Cardiovascular:  Negative for chest pain.  Gastrointestinal:  Negative for abdominal pain, nausea and vomiting.  Genitourinary:  Negative for dysuria.  Skin:  Negative for rash.      Objective:    BP (!) 158/94   Pulse 75   Wt 210 lb (95.3 kg)   BMI 34.95 kg/m  Physical Exam Exam conducted with a chaperone present.  Constitutional:      General: She is not in acute distress.    Appearance: She is well-developed.  HENT:     Head: Normocephalic and atraumatic.  Eyes:     General: No scleral icterus. Cardiovascular:     Rate and Rhythm: Normal rate.  Pulmonary:     Effort: Pulmonary effort is normal.  Abdominal:     Palpations: Abdomen is soft.  Musculoskeletal:     Cervical back: Neck supple.  Skin:    General: Skin is warm and dry.  Neurological:     Mental Status: She is alert and oriented to person, place, and time.         Assessment & Plan:   Total time in review of prior notes, pathology, labs, history taking, review with patient, exam, note writing, discussion of options, plan for next steps, alternatives and risks of treatment: *** minutes.  No follow-ups on file.  [name], MD 11/26/2024 4:06 PM   "

## 2024-11-27 ENCOUNTER — Encounter: Payer: Self-pay | Admitting: Family Medicine

## 2024-11-27 NOTE — Assessment & Plan Note (Signed)
 Suspect her body is getting used to the IUD--suspect this will improve. Take Aygestin  if needed to make it stop.

## 2024-11-27 NOTE — Assessment & Plan Note (Signed)
On HRT 

## 2024-12-03 MED ORDER — AMOXICILLIN-POT CLAVULANATE 875-125 MG PO TABS: 1.0000 | ORAL_TABLET | Freq: Two times a day (BID) | ORAL | 0 refills | Status: AC

## 2024-12-17 ENCOUNTER — Ambulatory Visit: Admitting: Family Medicine

## 2024-12-17 VITALS — BP 168/100 | HR 84

## 2024-12-17 DIAGNOSIS — N907 Vulvar cyst: Secondary | ICD-10-CM

## 2024-12-17 DIAGNOSIS — N939 Abnormal uterine and vaginal bleeding, unspecified: Secondary | ICD-10-CM

## 2024-12-17 NOTE — Progress Notes (Unsigned)
 Spotting never stops, then started bleeding heavier for past 2 weeks.   Intercourse is now painful again

## 2024-12-18 ENCOUNTER — Encounter: Payer: Self-pay | Admitting: Family Medicine

## 2024-12-18 NOTE — Assessment & Plan Note (Signed)
 Continue Aygestin  prn and IUD. Should continue to improve as the lining gets used to having the IUD there. The estrogen has probably worsened things mildly.

## 2024-12-18 NOTE — Progress Notes (Signed)
" ° ° °  Subjective:    Patient ID: Sherri [name] is a 49 y.o. female presenting with Vaginal Bleeding  on 12/17/2024  HPI: Here for f/u bleeding. IUD in place since 9/25. On Aygestin  as well. Has had continued spotting. Bleeding heavier lately. We drained a cyst last visit, that improved w/ Abx. On estrogen and dyspareunia improved initially but now feeling more pain during intercourse.  Review of Systems  Constitutional:  Negative for chills and fever.  Respiratory:  Negative for shortness of breath.   Cardiovascular:  Negative for chest pain.  Gastrointestinal:  Negative for abdominal pain, nausea and vomiting.  Genitourinary:  Negative for dysuria.  Skin:  Negative for rash.      Objective:    BP (!) 168/100   Pulse 84  Physical Exam Exam conducted with a chaperone present.  Constitutional:      General: She is not in acute distress.    Appearance: She is well-developed.  HENT:     Head: Normocephalic and atraumatic.  Eyes:     General: No scleral icterus. Cardiovascular:     Rate and Rhythm: Normal rate.  Pulmonary:     Effort: Pulmonary effort is normal.  Abdominal:     Palpations: Abdomen is soft.  Genitourinary:    Comments: BUS normal, vagina is pink and rugated, cervix is without lesion. IUD strings noted and tucked up into cx.  Musculoskeletal:     Cervical back: Neck supple.  Skin:    General: Skin is warm and dry.  Neurological:     Mental Status: She is alert and oriented to person, place, and time.         Assessment & Plan:   Problem List Items Addressed This Visit       Unprioritized   Abnormal uterine bleeding - Primary   Continue Aygestin  prn and IUD. Should continue to improve as the lining gets used to having the IUD there. The estrogen has probably worsened things mildly.      Other Visit Diagnoses       Vulvar cyst       Healing well.        Return if symptoms worsen or fail to improve.  [name], MD 12/18/2024 9:01  AM   "
